# Patient Record
Sex: Female | Born: 1990 | Race: Black or African American | Hispanic: No | Marital: Single | Smoking: Current some day smoker
Health system: Southern US, Community
[De-identification: ages and names within clinical notes are randomized; demographics above are authoritative.]

## PROBLEM LIST (undated history)

## (undated) ENCOUNTER — Inpatient Hospital Stay (HOSPITAL_COMMUNITY): Payer: Self-pay

## (undated) DIAGNOSIS — O139 Gestational [pregnancy-induced] hypertension without significant proteinuria, unspecified trimester: Secondary | ICD-10-CM

## (undated) HISTORY — PX: DILATION AND CURETTAGE OF UTERUS: SHX78

## (undated) HISTORY — DX: Gestational (pregnancy-induced) hypertension without significant proteinuria, unspecified trimester: O13.9

---
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
## 2008-11-30 ENCOUNTER — Emergency Department (HOSPITAL_COMMUNITY): Admission: EM | Admit: 2008-11-30 | Discharge: 2008-11-30 | Payer: Self-pay | Admitting: Emergency Medicine

## 2009-06-03 ENCOUNTER — Inpatient Hospital Stay (HOSPITAL_COMMUNITY): Admission: AD | Admit: 2009-06-03 | Discharge: 2009-06-04 | Payer: Self-pay | Admitting: Obstetrics and Gynecology

## 2009-06-22 ENCOUNTER — Inpatient Hospital Stay (HOSPITAL_COMMUNITY): Admission: AD | Admit: 2009-06-22 | Discharge: 2009-06-24 | Payer: Self-pay | Admitting: Obstetrics and Gynecology

## 2009-06-23 ENCOUNTER — Encounter (INDEPENDENT_AMBULATORY_CARE_PROVIDER_SITE_OTHER): Payer: Self-pay | Admitting: Obstetrics and Gynecology

## 2009-08-14 ENCOUNTER — Emergency Department: Payer: Self-pay | Admitting: Emergency Medicine

## 2010-01-26 ENCOUNTER — Emergency Department: Payer: Self-pay | Admitting: Emergency Medicine

## 2010-02-04 ENCOUNTER — Emergency Department: Payer: Self-pay | Admitting: Emergency Medicine

## 2010-06-09 ENCOUNTER — Ambulatory Visit (HOSPITAL_COMMUNITY)
Admission: RE | Admit: 2010-06-09 | Discharge: 2010-06-09 | Disposition: A | Discharge status: Home / Self Care | Payer: Medicaid Other | Source: Ambulatory Visit | Attending: Obstetrics and Gynecology | Admitting: Obstetrics and Gynecology

## 2010-06-09 ENCOUNTER — Other Ambulatory Visit: Payer: Self-pay | Admitting: Obstetrics and Gynecology

## 2010-06-09 DIAGNOSIS — O021 Missed abortion: Secondary | ICD-10-CM | POA: Insufficient documentation

## 2010-06-09 LAB — DIFFERENTIAL
Lymphocytes Relative: 61 % — ABNORMAL HIGH (ref 12–46)
Lymphs Abs: 2.2 10*3/uL (ref 0.7–4.0)
Monocytes Absolute: 0.4 10*3/uL (ref 0.1–1.0)
Monocytes Relative: 12 % (ref 3–12)
Neutro Abs: 1 10*3/uL — ABNORMAL LOW (ref 1.7–7.7)
Neutrophils Relative %: 27 % — ABNORMAL LOW (ref 43–77)

## 2010-06-09 LAB — CBC
HCT: 37.1 % (ref 36.0–46.0)
Hemoglobin: 12.4 g/dL (ref 12.0–15.0)
MCH: 27.8 pg (ref 26.0–34.0)
MCHC: 33.4 g/dL (ref 30.0–36.0)
RBC: 4.46 MIL/uL (ref 3.87–5.11)

## 2010-06-22 DEATH — deceased

## 2010-07-13 LAB — URINALYSIS, ROUTINE W REFLEX MICROSCOPIC
Bilirubin Urine: NEGATIVE
Glucose, UA: NEGATIVE mg/dL
Ketones, ur: NEGATIVE mg/dL
Nitrite: NEGATIVE
Specific Gravity, Urine: <1.005 — ABNORMAL LOW (ref 1.005–1.030)
pH: 7 (ref 5.0–8.0)

## 2010-07-17 LAB — COMPREHENSIVE METABOLIC PANEL
AST: 17 U/L (ref 0–37)
Albumin: 2.2 g/dL — ABNORMAL LOW (ref 3.5–5.2)
BUN: 1 mg/dL — ABNORMAL LOW (ref 6–23)
Calcium: 9.3 mg/dL (ref 8.4–10.5)
Creatinine, Ser: 0.59 mg/dL (ref 0.4–1.2)
GFR calc Af Amer: >60 mL/min (ref >60)
Total Bilirubin: 0.4 mg/dL (ref 0.3–1.2)
Total Protein: 5.5 g/dL — ABNORMAL LOW (ref 6.0–8.3)

## 2010-07-17 LAB — URINALYSIS, ROUTINE W REFLEX MICROSCOPIC
Glucose, UA: NEGATIVE mg/dL
Ketones, ur: NEGATIVE mg/dL
Leukocytes, UA: NEGATIVE
Nitrite: NEGATIVE
Specific Gravity, Urine: 1.01 (ref 1.005–1.030)
pH: 7 (ref 5.0–8.0)

## 2010-07-17 LAB — CBC
HCT: 37.3 % (ref 36.0–46.0)
Hemoglobin: 11.7 g/dL — ABNORMAL LOW (ref 12.0–15.0)
Hemoglobin: 12.3 g/dL (ref 12.0–15.0)
MCHC: 32.7 g/dL (ref 30.0–36.0)
MCHC: 32.9 g/dL (ref 30.0–36.0)
MCV: 84.2 fL (ref 78.0–100.0)
MCV: 85.4 fL (ref 78.0–100.0)
Platelets: 309 10*3/uL (ref 150–400)
Platelets: 332 10*3/uL (ref 150–400)
RBC: 4.45 MIL/uL (ref 3.87–5.11)
RDW: 14.6 % (ref 11.5–15.5)
RDW: 14.8 % (ref 11.5–15.5)
WBC: 5.6 10*3/uL (ref 4.0–10.5)
WBC: 8.1 10*3/uL (ref 4.0–10.5)

## 2010-07-17 LAB — RAPID URINE DRUG SCREEN, HOSP PERFORMED
Amphetamines: NOT DETECTED
Barbiturates: NOT DETECTED
Benzodiazepines: NOT DETECTED
Cocaine: NOT DETECTED

## 2010-07-17 LAB — URIC ACID: Uric Acid, Serum: 4.5 mg/dL (ref 2.4–7.0)

## 2010-07-17 LAB — URINE MICROSCOPIC-ADD ON

## 2010-07-17 LAB — RPR: RPR Ser Ql: NONREACTIVE

## 2010-07-30 LAB — DIFFERENTIAL
Basophils Absolute: 0 10*3/uL (ref 0.0–0.1)
Basophils Relative: 1 % (ref 0–1)
Eosinophils Absolute: 0.1 10*3/uL (ref 0.0–0.7)
Neutrophils Relative %: 49 % (ref 43–77)

## 2010-07-30 LAB — URINALYSIS, ROUTINE W REFLEX MICROSCOPIC
Bilirubin Urine: NEGATIVE
Hgb urine dipstick: NEGATIVE
Nitrite: NEGATIVE
Protein, ur: NEGATIVE mg/dL
Urobilinogen, UA: 0.2 mg/dL (ref 0.0–1.0)

## 2010-07-30 LAB — CBC
MCHC: 33.8 g/dL (ref 30.0–36.0)
MCV: 86 fL (ref 78.0–100.0)
Platelets: 293 10*3/uL (ref 150–400)
RDW: 14.1 % (ref 11.5–15.5)

## 2010-07-30 LAB — WET PREP, GENITAL
Trich, Wet Prep: NONE SEEN
Yeast Wet Prep HPF POC: NONE SEEN

## 2010-07-30 LAB — ABO/RH: ABO/RH(D): O POS

## 2010-07-30 LAB — URINE MICROSCOPIC-ADD ON

## 2010-07-30 LAB — BASIC METABOLIC PANEL
BUN: 5 mg/dL — ABNORMAL LOW (ref 6–23)
CO2: 25 mEq/L (ref 19–32)
Chloride: 105 mEq/L (ref 96–112)
Creatinine, Ser: 0.65 mg/dL (ref 0.4–1.2)

## 2010-07-30 LAB — GC/CHLAMYDIA PROBE AMP, GENITAL: GC Probe Amp, Genital: NEGATIVE

## 2010-07-30 LAB — HCG, QUANTITATIVE, PREGNANCY: hCG, Beta Chain, Quant, S: 104616 m[IU]/mL — ABNORMAL HIGH (ref <5)

## 2011-02-25 ENCOUNTER — Emergency Department: Payer: Self-pay | Admitting: *Deleted

## 2011-04-06 ENCOUNTER — Emergency Department: Payer: Self-pay | Admitting: Emergency Medicine

## 2011-04-24 NOTE — L&D Delivery Note (Signed)
Delivery Note At 4:06 AM a viable and healthy female was delivered via Vaginal, Spontaneous Delivery (Presentation: Right Occiput Anterior).  APGAR: 8, 9; weight 6 lb 13.4 oz (3100 g).   Placenta status: expressed, intact.  Cord: 3 vessels with the following complications: None.    Anesthesia: Epidural  Episiotomy: None Lacerations: None Suture Repair: N/A Est. Blood Loss (mL): 200 ml  Mom to postpartum.  Baby to nursery-stable.  LEFTWICH-KIRBY, Doyle Kunath 11/25/2011, 4:29 AM

## 2011-05-01 ENCOUNTER — Inpatient Hospital Stay (HOSPITAL_COMMUNITY)
Admission: AD | Admit: 2011-05-01 | Discharge: 2011-05-01 | Disposition: A | Discharge status: Home / Self Care | Payer: Medicaid Other | Source: Ambulatory Visit | Attending: Obstetrics and Gynecology | Admitting: Obstetrics and Gynecology

## 2011-05-01 ENCOUNTER — Encounter (HOSPITAL_COMMUNITY): Payer: Self-pay | Admitting: *Deleted

## 2011-05-01 DIAGNOSIS — B379 Candidiasis, unspecified: Secondary | ICD-10-CM

## 2011-05-01 DIAGNOSIS — O2390 Unspecified genitourinary tract infection in pregnancy, unspecified trimester: Secondary | ICD-10-CM | POA: Insufficient documentation

## 2011-05-01 DIAGNOSIS — B373 Candidiasis of vulva and vagina: Secondary | ICD-10-CM | POA: Insufficient documentation

## 2011-05-01 DIAGNOSIS — N949 Unspecified condition associated with female genital organs and menstrual cycle: Secondary | ICD-10-CM | POA: Insufficient documentation

## 2011-05-01 DIAGNOSIS — N760 Acute vaginitis: Secondary | ICD-10-CM

## 2011-05-01 DIAGNOSIS — B3731 Acute candidiasis of vulva and vagina: Secondary | ICD-10-CM | POA: Insufficient documentation

## 2011-05-01 DIAGNOSIS — B9689 Other specified bacterial agents as the cause of diseases classified elsewhere: Secondary | ICD-10-CM | POA: Insufficient documentation

## 2011-05-01 DIAGNOSIS — A499 Bacterial infection, unspecified: Secondary | ICD-10-CM | POA: Insufficient documentation

## 2011-05-01 MED ORDER — METRONIDAZOLE 500 MG PO TABS: 500.0000 mg | ORAL_TABLET | Freq: Two times a day (BID) | ORAL | Status: AC

## 2011-05-01 MED ORDER — FLUCONAZOLE 150 MG PO TABS
150.0000 mg | ORAL_TABLET | Freq: Once | ORAL | Status: AC
  Administered 2011-05-01: 150 mg via ORAL
  Filled 2011-05-01: qty 1

## 2011-05-01 NOTE — ED Provider Notes (Signed)
Agree with above note.  [name] [name] 05/01/2011 7:17 AM

## 2011-05-01 NOTE — ED Provider Notes (Signed)
History     CSN: [id number]  Arrival date & time 05/01/11  0026   None     Chief Complaint  Patient presents with  . Vaginal Discharge    HPI [name] [name] is a 21 y.o. female @ [redacted] weeks gestation who presents to MAU for vaginal discharge. She states that a couple weeks ago she went to [facility] and had pregnancy conformation and treatment for BV. She seemed to get better and then the vaginal discharge and itching returned. She plans her [facility] with [facility]. The history was provided by the patient.   History reviewed. No pertinent past medical history.  Past Surgical History  Procedure Date  . Dilation and curettage of uterus     Family History  Problem Relation Age of Onset  . Anesthesia problems Neg Hx   . Hypotension Neg Hx   . Malignant hyperthermia Neg Hx   . Pseudochol deficiency Neg Hx     History  Substance Use Topics  . Smoking status: Current Some Day Smoker -- 0.2 packs/day  . Smokeless tobacco: Never Used  . Alcohol Use: No    OB History    Grav Para Term Preterm Abortions TAB SAB Ect Mult Living   4 2 2  1  1   2       Review of Systems  Constitutional: Negative for fever, chills, diaphoresis and fatigue.  HENT: Negative for ear pain, congestion, sore throat, facial swelling, neck pain, neck stiffness, dental problem and sinus pressure.   Eyes: Negative for photophobia, pain and discharge.  Respiratory: Negative for cough, chest tightness and wheezing.   Gastrointestinal: Negative for nausea, vomiting, abdominal pain, diarrhea, constipation and abdominal distention.  Genitourinary: Positive for vaginal discharge. Negative for dysuria, frequency, flank pain, vaginal bleeding, difficulty urinating and vaginal pain.       Pregnant  Musculoskeletal: Negative for myalgias, back pain and gait problem.  Skin: Negative for color change and rash.  Neurological: Negative for dizziness, speech difficulty, weakness, light-headedness, numbness and headaches.    Psychiatric/Behavioral: Negative for confusion and agitation.    Allergies  Review of patient's allergies indicates no known allergies.  Home Medications  No current outpatient prescriptions on file.  BP 123/51  Pulse 89  Temp(Src) 98.9 F (37.2 C) (Oral)  Resp 20  Ht 5\' 1"  (1.549 m)  Wt 138 lb (62.596 kg)  BMI 26.07 kg/m2  SpO2 99%  LMP 02/14/2011  Physical Exam  Nursing note and vitals reviewed. Constitutional: She is oriented to person, place, and time. She appears well-developed and well-nourished.  HENT:  Head: Normocephalic.  Eyes: EOM are normal.  Neck: Neck supple.  Cardiovascular: Normal rate.   Pulmonary/Chest: Effort normal.  Abdominal: Soft. There is no tenderness.  Genitourinary:       External genitalia without lesions. Thick white vaginal discharge. Cervix long, closed, no CMT, no adnexal tenderness or mass palpable. Uterus 10 to 12 week size.  Musculoskeletal: Normal range of motion.  Neurological: She is alert and oriented to person, place, and time. No cranial nerve deficit.  Skin: Skin is warm and dry.  Psychiatric: She has a normal mood and affect. Her behavior is normal. Judgment and thought content normal.   Results for orders placed during the hospital encounter of 05/01/11 (from the past 24 hour(s))  WET PREP, GENITAL     Status: Abnormal   Collection Time   05/01/11  1:15 AM      Component Value Range   Yeast,  Wet Prep FEW (*) NONE SEEN    Trich, Wet Prep NONE SEEN  NONE SEEN    Clue Cells, Wet Prep MODERATE (*) NONE SEEN    WBC, Wet Prep HPF POC MANY (*) NONE SEEN     ED Course  Procedures Informal bedside ultrasound shows active IUP. Patient able to visualize activity.  Assessment: Bacterial vaginosis   Yeast infection  Plan:  Diflucan 150 mg now   Flagyl Rx   Prenatal care with [facility]   MDM          Kerrie [place], [place] 05/01/11 [phone]

## 2011-06-11 ENCOUNTER — Encounter (HOSPITAL_COMMUNITY): Payer: Self-pay | Admitting: *Deleted

## 2011-07-06 ENCOUNTER — Inpatient Hospital Stay (HOSPITAL_COMMUNITY)
Admission: AD | Admit: 2011-07-06 | Discharge: 2011-07-06 | Disposition: A | Discharge status: Home / Self Care | Payer: Medicaid Other | Source: Ambulatory Visit | Attending: Obstetrics & Gynecology | Admitting: Obstetrics & Gynecology

## 2011-07-06 ENCOUNTER — Encounter (HOSPITAL_COMMUNITY): Payer: Self-pay | Admitting: *Deleted

## 2011-07-06 DIAGNOSIS — N390 Urinary tract infection, site not specified: Secondary | ICD-10-CM | POA: Insufficient documentation

## 2011-07-06 DIAGNOSIS — O2390 Unspecified genitourinary tract infection in pregnancy, unspecified trimester: Secondary | ICD-10-CM | POA: Insufficient documentation

## 2011-07-06 DIAGNOSIS — N949 Unspecified condition associated with female genital organs and menstrual cycle: Secondary | ICD-10-CM

## 2011-07-06 DIAGNOSIS — R109 Unspecified abdominal pain: Secondary | ICD-10-CM | POA: Insufficient documentation

## 2011-07-06 LAB — URINALYSIS, ROUTINE W REFLEX MICROSCOPIC
Glucose, UA: NEGATIVE mg/dL
Leukocytes, UA: NEGATIVE
Nitrite: NEGATIVE
Specific Gravity, Urine: <1.005 — ABNORMAL LOW (ref 1.005–1.030)
pH: 7 (ref 5.0–8.0)

## 2011-07-06 MED ORDER — PRENATAL RX 60-1 MG PO TABS: 1.0000 | ORAL_TABLET | Freq: Every day | ORAL | Status: AC

## 2011-07-06 NOTE — MAU Note (Signed)
Pt in c/o frequency of urination, and inability to control bladder. Denies any burning or pain with urination.  Denies any bleeding, leaking of fluid, or discharge.  Felt flutters 2 weeks ago, not much since.

## 2011-07-06 NOTE — MAU Provider Note (Signed)
History    CSN: [id number]  Arrival date and time: 07/06/11 0913  [name] [name] is a [id number] at [redacted]w[redacted]d presenting with pelvic pain       Chief Complaint  Patient presents with  . Urinary Tract Infection   HPI [name] [name] is a 21 yo [id number] at [redacted]w[redacted]d presenting with pelvic pain for 2 weeks.  Patient describes the pelvic pain as cramping with occasional sharper pains with a rating 6-7/10.  Patient also c/o increased urine frequency and urgency.  She denies dysuria, hesitancy,vaginal discharge or vaginal bleeding.  She also denies low back pain, but does have occasional sciatica down her legs.  She doesn't recall having similar pains with past pregnancies or previous UTIs.  Patient declined pelvic exam.  Pertinent Gynecological History: Sexually transmitted diseases: no past history Previous GYN Procedures: DNC in 2012   History reviewed. No pertinent past medical history.  Past Surgical History  Procedure Date  . Dilation and curettage of uterus     Family History  Problem Relation Age of Onset  . Anesthesia problems Neg Hx   . Hypotension Neg Hx   . Malignant hyperthermia Neg Hx   . Pseudochol deficiency Neg Hx     History  Substance Use Topics  . Smoking status: Current Some Day Smoker -- 0.2 packs/day  . Smokeless tobacco: Never Used  . Alcohol Use: No    Allergies: No Known Allergies  Prescriptions prior to admission  Medication Sig Dispense Refill  . Prenatal Vit-Fe Fumarate-FA (PRENATAL MULTIVITAMIN) TABS Take 1 tablet by mouth daily.          Review of Systems  Constitutional: Negative for fever, chills and malaise/fatigue.  Gastrointestinal: Negative for nausea, vomiting and diarrhea.  Musculoskeletal: Negative for back pain (occasional sciatica).   Physical Exam   Blood pressure 129/76, pulse 84, temperature 96.4 F (35.8 C), temperature source Oral, resp. rate 16, height 5\' 1"  (1.549 m), weight 67.189 kg (148 lb 2 oz), last menstrual period  02/14/2011.  Physical Exam  Constitutional: She is oriented to person, place, and time. She appears well-developed and well-nourished. No distress.  HENT:  Head: Normocephalic and atraumatic.  Neck: Neck supple.  Respiratory: Effort normal. No respiratory distress.  GI: Soft. Bowel sounds are normal. She exhibits distension (gravida). There is no tenderness.  Musculoskeletal: Normal range of motion.  Neurological: She is alert and oriented to person, place, and time.  Pelvic: Deferred   MAU Course  Procedures Results for orders placed during the hospital encounter of 07/06/11 (from the past 24 hour(s))  URINALYSIS, ROUTINE W REFLEX MICROSCOPIC     Status: Abnormal   Collection Time   07/06/11  9:15 AM      Component Value Range   Color, Urine STRAW (*) YELLOW    APPearance CLEAR  CLEAR    Specific Gravity, Urine <1.005 (*) 1.005 - 1.030    pH 7.0  5.0 - 8.0    Glucose, UA NEGATIVE  NEGATIVE (mg/dL)   Hgb urine dipstick NEGATIVE  NEGATIVE    Bilirubin Urine NEGATIVE  NEGATIVE    Ketones, ur NEGATIVE  NEGATIVE (mg/dL)   Protein, ur NEGATIVE  NEGATIVE (mg/dL)   Urobilinogen, UA 0.2  0.0 - 1.0 (mg/dL)   Nitrite NEGATIVE  NEGATIVE    Leukocytes, UA NEGATIVE  NEGATIVE    Assessment and Plan  21 yo [id number] at [redacted]w[redacted]d presenting with round ligament pain  Provided educational material for round ligament pain.  Patient aware to seek medical  treatment if she experiences vaginal bleeding, abnormal vaginal discharge, or increased pain. Discharge home.  Mardene Speak 07/06/2011, 10:25 AM

## 2011-07-06 NOTE — MAU Note (Cosign Needed)
Pt states having bladder pain, has urgency, no burning with voiding, denies bleeding or abnormal vaginal d/c changes. Pain has been intermittent x2-3 days, worsened this am. Urine has a pale yellow/light green color per pt.

## 2011-07-06 NOTE — MAU Note (Signed)
Pt given d/c instruction and discharged by resident in MAU. D/Cing pt out of Epic for [place], [employer]

## 2011-07-18 ENCOUNTER — Observation Stay: Payer: Self-pay | Admitting: Advanced Practice Midwife

## 2011-07-18 LAB — URINALYSIS, COMPLETE
Bilirubin,UR: NEGATIVE
Glucose,UR: NEGATIVE mg/dL (ref 0–75)
Leukocyte Esterase: NEGATIVE
Nitrite: NEGATIVE
Ph: 6 (ref 4.5–8.0)
RBC,UR: NONE SEEN /HPF (ref 0–5)
Squamous Epithelial: 4
WBC UR: 1 /HPF (ref 0–5)

## 2011-08-06 ENCOUNTER — Ambulatory Visit: Payer: Medicaid Other | Admitting: Gynecology

## 2011-08-06 VITALS — BP 115/61 | Wt 149.0 lb

## 2011-08-06 DIAGNOSIS — O3680X0 Pregnancy with inconclusive fetal viability, not applicable or unspecified: Secondary | ICD-10-CM

## 2011-08-06 DIAGNOSIS — Z3480 Encounter for supervision of other normal pregnancy, unspecified trimester: Secondary | ICD-10-CM

## 2011-08-07 ENCOUNTER — Ambulatory Visit (HOSPITAL_COMMUNITY)
Admission: RE | Admit: 2011-08-07 | Discharge: 2011-08-07 | Disposition: A | Discharge status: Home / Self Care | Payer: Medicaid Other | Source: Ambulatory Visit | Attending: Family Medicine | Admitting: Family Medicine

## 2011-08-07 DIAGNOSIS — Z3689 Encounter for other specified antenatal screening: Secondary | ICD-10-CM | POA: Insufficient documentation

## 2011-08-07 DIAGNOSIS — O3680X0 Pregnancy with inconclusive fetal viability, not applicable or unspecified: Secondary | ICD-10-CM

## 2011-08-07 LAB — OBSTETRIC PANEL
Antibody Screen: NEGATIVE
Basophils Absolute: 0 10*3/uL (ref 0.0–0.1)
HCT: 37.3 % (ref 36.0–46.0)
Hemoglobin: 12.4 g/dL (ref 12.0–15.0)
Hepatitis B Surface Ag: NEGATIVE
Lymphocytes Relative: 29 % (ref 12–46)
Monocytes Absolute: 0.5 10*3/uL (ref 0.1–1.0)
Monocytes Relative: 9 % (ref 3–12)
Neutro Abs: 3.5 10*3/uL (ref 1.7–7.7)
Neutrophils Relative %: 60 % (ref 43–77)
Rubella: 144.5 IU/mL — ABNORMAL HIGH
WBC: 5.8 10*3/uL (ref 4.0–10.5)

## 2011-08-08 ENCOUNTER — Encounter: Payer: Self-pay | Admitting: Obstetrics & Gynecology

## 2011-08-08 ENCOUNTER — Ambulatory Visit (INDEPENDENT_AMBULATORY_CARE_PROVIDER_SITE_OTHER): Payer: Medicaid Other | Admitting: Obstetrics & Gynecology

## 2011-08-08 ENCOUNTER — Other Ambulatory Visit (HOSPITAL_COMMUNITY)
Admission: RE | Admit: 2011-08-08 | Discharge: 2011-08-08 | Disposition: A | Discharge status: Home / Self Care | Payer: Medicaid Other | Source: Ambulatory Visit | Attending: Obstetrics & Gynecology | Admitting: Obstetrics & Gynecology

## 2011-08-08 VITALS — BP 122/62 | Wt 148.0 lb

## 2011-08-08 DIAGNOSIS — IMO0002 Reserved for concepts with insufficient information to code with codable children: Secondary | ICD-10-CM

## 2011-08-08 DIAGNOSIS — O99330 Smoking (tobacco) complicating pregnancy, unspecified trimester: Secondary | ICD-10-CM

## 2011-08-08 DIAGNOSIS — Z113 Encounter for screening for infections with a predominantly sexual mode of transmission: Secondary | ICD-10-CM | POA: Insufficient documentation

## 2011-08-08 DIAGNOSIS — Z3490 Encounter for supervision of normal pregnancy, unspecified, unspecified trimester: Secondary | ICD-10-CM | POA: Insufficient documentation

## 2011-08-08 DIAGNOSIS — Z01419 Encounter for gynecological examination (general) (routine) without abnormal findings: Secondary | ICD-10-CM | POA: Insufficient documentation

## 2011-08-08 DIAGNOSIS — O0930 Supervision of pregnancy with insufficient antenatal care, unspecified trimester: Secondary | ICD-10-CM

## 2011-08-08 DIAGNOSIS — Z3480 Encounter for supervision of other normal pregnancy, unspecified trimester: Secondary | ICD-10-CM

## 2011-08-08 HISTORY — DX: Encounter for supervision of normal pregnancy, unspecified, unspecified trimester: Z34.90

## 2011-08-08 HISTORY — DX: Smoking (tobacco) complicating pregnancy, unspecified trimester: O99.330

## 2011-08-08 HISTORY — DX: Supervision of pregnancy with insufficient antenatal care, unspecified trimester: O09.30

## 2011-08-08 NOTE — Progress Notes (Signed)
   Subjective:    [name] [name] is a [id number] [redacted]w[redacted]d being seen today for her first obstetrical visit.  Her obstetrical history is significant for late prenatal care and smoking in pregnancy. Patient does not intend to breast feed. Pregnancy history fully reviewed.  She underwent an ultrasound a few days days ago, normal anatomy. Too late for genetic testing.  Patient reports no complaints.  Filed Vitals:   08/08/11 0800  BP: 122/62  Weight: 148 lb (67.132 kg)    HISTORY: OB History    Grav Para Term Preterm Abortions TAB SAB Ect Mult Living   4 2 2  1  1   2      # Outc Date GA Lbr Len/2nd Wgt Sex Del Anes PTL Lv   1 TRM 8/09    F SVD EPI  Yes   2 TRM 3/11 [redacted]w[redacted]d   F SVD EPI  Yes   3 SAB 2012 [redacted]w[redacted]d   U    No   Comments: miscarriage   4 CUR              Past Medical History  Diagnosis Date  . Pregnancy induced hypertension 2009   Past Surgical History  Procedure Date  . Dilation and curettage of uterus    Family History  Problem Relation Age of Onset  . Anesthesia problems Neg Hx   . Hypotension Neg Hx   . Malignant hyperthermia Neg Hx   . Pseudochol deficiency Neg Hx      Exam    Uterus:     Pelvic Exam:    Perineum: Normal Perineum   Vulva: normal   Vagina:  normal mucosa, normal discharge   pH: Not done   Cervix: multiparous appearance, no bleeding following Pap, no cervical motion tenderness and no lesions   Adnexa: normal adnexa and no mass, fullness, tenderness   Bony Pelvis: gynecoid  System: Breast:  normal appearance, no masses or tenderness   Skin: normal coloration and turgor, no rashes    Neurologic: normal   Extremities: normal strength, tone, and muscle mass   HEENT PERRLA and extra ocular movement intact   Mouth/Teeth mucous membranes moist, pharynx normal without lesions   Neck supple and no masses   Cardiovascular: regular rate and rhythm   Respiratory:  appears well, vitals normal, no respiratory distress, acyanotic, normal RR   Abdomen:  soft, non-tender; bowel sounds normal; no masses,  no organomegaly   Urinary: urethral meatus normal     Assessment:    Pregnancy: [id number] Patient Active Problem List  Diagnoses  . Supervision of normal pregnancy  . Late prenatal care  . Tobacco smoking complicating pregnancy    Plan:   Initial labs normal. Continue prenatal vitamins. Problem list reviewed and updated. Reports smoking 3 cigarettes/week.  Encouraged to quit, resources reviewed. Follow up in 3 weeks for 1 hr GTT, third trimester labs. Fetal movement and labor precautions reviewed.   Jaydence Arnesen A 08/08/2011

## 2011-08-08 NOTE — Patient Instructions (Signed)
Pregnancy - Second Trimester The second trimester of pregnancy (3 to 6 months) is a period of rapid growth for you and your baby. At the end of the sixth month, your baby is about 9 inches long and weighs 1 1/2 pounds. You will begin to feel the baby move between 18 and 20 weeks of the pregnancy. This is called quickening. Weight gain is faster. A clear fluid (colostrum) may leak out of your breasts. You may feel small contractions of the womb (uterus). This is known as false labor or Braxton-Hicks contractions. This is like a practice for labor when the baby is ready to be born. Usually, the problems with morning sickness have usually passed by the end of your first trimester. Some women develop small dark blotches (called cholasma, mask of pregnancy) on their face that usually goes away after the baby is born. Exposure to the sun makes the blotches worse. Acne may also develop in some pregnant women and pregnant women who have acne, may find that it goes away. PRENATAL EXAMS  Blood work may continue to be done during prenatal exams. These tests are done to check on your health and the probable health of your baby. Blood work is used to follow your blood levels (hemoglobin). Anemia (low hemoglobin) is common during pregnancy. Iron and vitamins are given to help prevent this. You will also be checked for diabetes between 24 and 28 weeks of the pregnancy. Some of the previous blood tests may be repeated.   The size of the uterus is measured during each visit. This is to make sure that the baby is continuing to grow properly according to the dates of the pregnancy.   Your blood pressure is checked every prenatal visit. This is to make sure you are not getting toxemia.   Your urine is checked to make sure you do not have an infection, diabetes or protein in the urine.   Your weight is checked often to make sure gains are happening at the suggested rate. This is to ensure that both you and your baby are  growing normally.   Sometimes, an ultrasound is performed to confirm the proper growth and development of the baby. This is a test which bounces harmless sound waves off the baby so your caregiver can more accurately determine due dates.  Sometimes, a specialized test is done on the amniotic fluid surrounding the baby. This test is called an amniocentesis. The amniotic fluid is obtained by sticking a needle into the belly (abdomen). This is done to check the chromosomes in instances where there is a concern about possible genetic problems with the baby. It is also sometimes done near the end of pregnancy if an early delivery is required. In this case, it is done to help make sure the baby's lungs are mature enough for the baby to live outside of the womb. CHANGES OCCURING IN THE SECOND TRIMESTER OF PREGNANCY Your body goes through many changes during pregnancy. They vary from person to person. Talk to your caregiver about changes you notice that you are concerned about.  During the second trimester, you will likely have an increase in your appetite. It is normal to have cravings for certain foods. This varies from person to person and pregnancy to pregnancy.   Your lower abdomen will begin to bulge.   You may have to urinate more often because the uterus and baby are pressing on your bladder. It is also common to get more bladder infections during pregnancy (  pain with urination). You can help this by drinking lots of fluids and emptying your bladder before and after intercourse.   You may begin to get stretch marks on your hips, abdomen, and breasts. These are normal changes in the body during pregnancy. There are no exercises or medications to take that prevent this change.   You may begin to develop swollen and bulging veins (varicose veins) in your legs. Wearing support hose, elevating your feet for 15 minutes, 3 to 4 times a day and limiting salt in your diet helps lessen the problem.    Heartburn may develop as the uterus grows and pushes up against the stomach. Antacids recommended by your caregiver helps with this problem. Also, eating smaller meals 4 to 5 times a day helps.   Constipation can be treated with a stool softener or adding bulk to your diet. Drinking lots of fluids, vegetables, fruits, and whole grains are helpful.   Exercising is also helpful. If you have been very active up until your pregnancy, most of these activities can be continued during your pregnancy. If you have been less active, it is helpful to start an exercise program such as walking.   Hemorrhoids (varicose veins in the rectum) may develop at the end of the second trimester. Warm sitz baths and hemorrhoid cream recommended by your caregiver helps hemorrhoid problems.   Backaches may develop during this time of your pregnancy. Avoid heavy lifting, wear low heal shoes and practice good posture to help with backache problems.   Some pregnant women develop tingling and numbness of their hand and fingers because of swelling and tightening of ligaments in the wrist (carpel tunnel syndrome). This goes away after the baby is born.   As your breasts enlarge, you may have to get a bigger bra. Get a comfortable, cotton, support bra. Do not get a nursing bra until the last month of the pregnancy if you will be nursing the baby.   You may get a dark line from your belly button to the pubic area called the linea nigra.   You may develop rosy cheeks because of increase blood flow to the face.   You may develop spider looking lines of the face, neck, arms and chest. These go away after the baby is born.  HOME CARE INSTRUCTIONS   It is extremely important to avoid all smoking, herbs, alcohol, and unprescribed drugs during your pregnancy. These chemicals affect the formation and growth of the baby. Avoid these chemicals throughout the pregnancy to ensure the delivery of a healthy infant.   Most of your home  care instructions are the same as suggested for the first trimester of your pregnancy. Keep your caregiver's appointments. Follow your caregiver's instructions regarding medication use, exercise and diet.   During pregnancy, you are providing food for you and your baby. Continue to eat regular, well-balanced meals. Choose foods such as meat, fish, milk and other low fat dairy products, vegetables, fruits, and whole-grain breads and cereals. Your caregiver will tell you of the ideal weight gain.   A physical sexual relationship may be continued up until near the end of pregnancy if there are no other problems. Problems could include early (premature) leaking of amniotic fluid from the membranes, vaginal bleeding, abdominal pain, or other medical or pregnancy problems.   Exercise regularly if there are no restrictions. Check with your caregiver if you are unsure of the safety of some of your exercises. The greatest weight gain will occur in the   last 2 trimesters of pregnancy. Exercise will help you:   Control your weight.   Get you in shape for labor and delivery.   Lose weight after you have the baby.   Wear a good support or jogging bra for breast tenderness during pregnancy. This may help if worn during sleep. Pads or tissues may be used in the bra if you are leaking colostrum.   Do not use hot tubs, steam rooms or saunas throughout the pregnancy.   Wear your seat belt at all times when driving. This protects you and your baby if you are in an accident.   Avoid raw meat, uncooked cheese, cat litter boxes and soil used by cats. These carry germs that can cause birth defects in the baby.   The second trimester is also a good time to visit your dentist for your dental health if this has not been done yet. Getting your teeth cleaned is OK. Use a soft toothbrush. Brush gently during pregnancy.   It is easier to loose urine during pregnancy. Tightening up and strengthening the pelvic muscles will  help with this problem. Practice stopping your urination while you are going to the bathroom. These are the same muscles you need to strengthen. It is also the muscles you would use as if you were trying to stop from passing gas. You can practice tightening these muscles up 10 times a set and repeating this about 3 times per day. Once you know what muscles to tighten up, do not perform these exercises during urination. It is more likely to contribute to an infection by backing up the urine.   Ask for help if you have financial, counseling or nutritional needs during pregnancy. Your caregiver will be able to offer counseling for these needs as well as refer you for other special needs.   Your skin may become oily. If so, wash your face with mild soap, use non-greasy moisturizer and oil or cream based makeup.  MEDICATIONS AND DRUG USE IN PREGNANCY  Take prenatal vitamins as directed. The vitamin should contain 1 milligram of folic acid. Keep all vitamins out of reach of children. Only a couple vitamins or tablets containing iron may be fatal to a baby or young child when ingested.   Avoid use of all medications, including herbs, over-the-counter medications, not prescribed or suggested by your caregiver. Only take over-the-counter or prescription medicines for pain, discomfort, or fever as directed by your caregiver. Do not use aspirin.   Let your caregiver also know about herbs you may be using.   Alcohol is related to a number of birth defects. This includes fetal alcohol syndrome. All alcohol, in any form, should be avoided completely. Smoking will cause low birth rate and premature babies.   Street or illegal drugs are very harmful to the baby. They are absolutely forbidden. A baby born to an addicted mother will be addicted at birth. The baby will go through the same withdrawal an adult does.  SEEK MEDICAL CARE IF:  You have any concerns or worries during your pregnancy. It is better to call with  your questions if you feel they cannot wait, rather than worry about them. SEEK IMMEDIATE MEDICAL CARE IF:   An unexplained oral temperature above 102 F (38.9 C) develops, or as your caregiver suggests.   You have leaking of fluid from the vagina (birth canal). If leaking membranes are suspected, take your temperature and tell your caregiver of this when you call.   There   is vaginal spotting, bleeding, or passing clots. Tell your caregiver of the amount and how many pads are used. Light spotting in pregnancy is common, especially following intercourse.   You develop a bad smelling vaginal discharge with a change in the color from clear to white.   You continue to feel sick to your stomach (nauseated) and have no relief from remedies suggested. You vomit blood or coffee ground-like materials.   You lose more than 2 pounds of weight or gain more than 2 pounds of weight over 1 week, or as suggested by your caregiver.   You notice swelling of your face, hands, feet, or legs.   You get exposed to German measles and have never had them.   You are exposed to fifth disease or chickenpox.   You develop belly (abdominal) pain. Round ligament discomfort is a common non-cancerous (benign) cause of abdominal pain in pregnancy. Your caregiver still must evaluate you.   You develop a bad headache that does not go away.   You develop fever, diarrhea, pain with urination, or shortness of breath.   You develop visual problems, blurry, or double vision.   You fall or are in a car accident or any kind of trauma.   There is mental or physical violence at home.  Document Released: 04/03/2001 Document Revised: 03/29/2011 Document Reviewed: 10/06/2008 [employer] Patient Information 2012 [employer]. Breastfeeding BENEFITS OF BREASTFEEDING For the baby  The first milk (colostrum) helps the baby's digestive system function better.   There are antibodies from the mother in the milk that help the  baby fight off infections.   The baby has a lower incidence of asthma, allergies, and SIDS (sudden infant death syndrome).   The nutrients in breast milk are better than formulas for the baby and helps the baby's brain grow better.   Babies who breastfeed have less gas, colic, and constipation.  For the mother  Breastfeeding helps develop a very special bond between mother and baby.   It is more convenient, always available at the correct temperature and cheaper than formula feeding.   It burns calories in the mother and helps with losing weight that was gained during pregnancy.   It makes the uterus contract back down to normal size faster and slows bleeding following delivery.   Breastfeeding mothers have a lower risk of developing breast cancer.  NURSE FREQUENTLY  A healthy, full-term baby may breastfeed as often as every hour or space his or her feedings to every 3 hours.   How often to nurse will vary from baby to baby. Watch your baby for signs of hunger, not the clock.   Nurse as often as the baby requests, or when you feel the need to reduce the fullness of your breasts.   Awaken the baby if it has been 3 to 4 hours since the last feeding.   Frequent feeding will help the mother make more milk and will prevent problems like sore nipples and engorgement of the breasts.  BABY'S POSITION AT THE BREAST  Whether lying down or sitting, be sure that the baby's tummy is facing your tummy.   Support the breast with 4 fingers underneath the breast and the thumb above. Make sure your fingers are well away from the nipple and baby's mouth.   Stroke the baby's lips and cheek closest to the breast gently with your finger or nipple.   When the baby's mouth is open wide enough, place all of your nipple and as much   of the dark area around the nipple as possible into your baby's mouth.   Pull the baby in close so the tip of the nose and the baby's cheeks touch the breast during the  feeding.  FEEDINGS  The length of each feeding varies from baby to baby and from feeding to feeding.   The baby must suck about 2 to 3 minutes for your milk to get to him or her. This is called a "let down." For this reason, allow the baby to feed on each breast as long as he or she wants. Your baby will end the feeding when he or she has received the right balance of nutrients.   To break the suction, put your finger into the corner of the baby's mouth and slide it between his or her gums before removing your breast from his or her mouth. This will help prevent sore nipples.  REDUCING BREAST ENGORGEMENT  In the first week after your baby is born, you may experience signs of breast engorgement. When breasts are engorged, they feel heavy, warm, full, and may be tender to the touch. You can reduce engorgement if you:   Nurse frequently, every 2 to 3 hours. Mothers who breastfeed early and often have fewer problems with engorgement.   Place light ice packs on your breasts between feedings. This reduces swelling. Wrap the ice packs in a lightweight towel to protect your skin.   Apply moist hot packs to your breast for 5 to 10 minutes before each feeding. This increases circulation and helps the milk flow.   Gently massage your breast before and during the feeding.   Make sure that the baby empties at least one breast at every feeding before switching sides.   Use a breast pump to empty the breasts if your baby is sleepy or not nursing well. You may also want to pump if you are returning to work or or you feel you are getting engorged.   Avoid bottle feeds, pacifiers or supplemental feedings of water or juice in place of breastfeeding.   Be sure the baby is latched on and positioned properly while breastfeeding.   Prevent fatigue, stress, and anemia.   Wear a supportive bra, avoiding underwire styles.   Eat a balanced diet with enough fluids.  If you follow these suggestions, your  engorgement should improve in 24 to 48 hours. If you are still experiencing difficulty, call your lactation consultant or caregiver. IS MY BABY GETTING ENOUGH MILK? Sometimes, mothers worry about whether their babies are getting enough milk. You can be assured that your baby is getting enough milk if:  The baby is actively sucking and you hear swallowing.   The baby nurses at least 8 to 12 times in a 24 hour time period. Nurse your baby until he or she unlatches or falls asleep at the first breast (at least 10 to 20 minutes), then offer the second side.   The baby is wetting 5 to 6 disposable diapers (6 to 8 cloth diapers) in a 24 hour period by 5 to 6 days of age.   The baby is having at least 2 to 3 stools every 24 hours for the first few months. Breast milk is all the food your baby needs. It is not necessary for your baby to have water or formula. In fact, to help your breasts make more milk, it is best not to give your baby supplemental feedings during the early weeks.   The stool should   be soft and yellow.   The baby should gain 4 to 7 ounces per week after he is 4 days old.  TAKE CARE OF YOURSELF Take care of your breasts by:  Bathing or showering daily.   Avoiding the use of soaps on your nipples.   Start feedings on your left breast at one feeding and on your right breast at the next feeding.   You will notice an increase in your milk supply 2 to 5 days after delivery. You may feel some discomfort from engorgement, which makes your breasts very firm and often tender. Engorgement "peaks" out within 24 to 48 hours. In the meantime, apply warm moist towels to your breasts for 5 to 10 minutes before feeding. Gentle massage and expression of some milk before feeding will soften your breasts, making it easier for your baby to latch on. Wear a well fitting nursing bra and air dry your nipples for 10 to 15 minutes after each feeding.   Only use cotton bra pads.   Only use pure lanolin on  your nipples after nursing. You do not need to wash it off before nursing.  Take care of yourself by:   Eating well-balanced meals and nutritious snacks.   Drinking milk, fruit juice, and water to satisfy your thirst (about 8 glasses a day).   Getting plenty of rest.   Increasing calcium in your diet (1200 mg a day).   Avoiding foods that you notice affect the baby in a bad way.  SEEK MEDICAL CARE IF:   You have any questions or difficulty with breastfeeding.   You need help.   You have a hard, red, sore area on your breast, accompanied by a fever of 100.5 F (38.1 C) or more.   Your baby is too sleepy to eat well or is having trouble sleeping.   Your baby is wetting less than 6 diapers per day, by 5 days of age.   Your baby's skin or white part of his or her eyes is more yellow than it was in the hospital.   You feel depressed.  Document Released: 04/09/2005 Document Revised: 03/29/2011 Document Reviewed: 11/22/2008 [employer] Patient Information 2012 [employer]. 

## 2011-08-08 NOTE — Progress Notes (Signed)
Addended by: Jaynie Collins A on: 08/08/2011 11:34 AM   Modules accepted: Orders

## 2011-08-09 LAB — CULTURE, URINE COMPREHENSIVE: Colony Count: 10000

## 2011-08-16 ENCOUNTER — Inpatient Hospital Stay (HOSPITAL_COMMUNITY)
Admission: AD | Admit: 2011-08-16 | Discharge: 2011-08-16 | Discharge status: Against Medical Advice | Payer: Medicaid Other | Source: Ambulatory Visit | Attending: Obstetrics & Gynecology | Admitting: Obstetrics & Gynecology

## 2011-08-16 NOTE — [facility] Note (Signed)
Pt called at 1607, 1627, and 1640, not in lobby. Admissions registrar unsure where pt went.

## 2011-08-29 ENCOUNTER — Ambulatory Visit (INDEPENDENT_AMBULATORY_CARE_PROVIDER_SITE_OTHER): Payer: Medicaid Other | Admitting: Obstetrics & Gynecology

## 2011-08-29 ENCOUNTER — Encounter: Payer: Self-pay | Admitting: Obstetrics & Gynecology

## 2011-08-29 VITALS — BP 126/70 | Wt 155.0 lb

## 2011-08-29 DIAGNOSIS — Z3480 Encounter for supervision of other normal pregnancy, unspecified trimester: Secondary | ICD-10-CM

## 2011-08-29 DIAGNOSIS — Z3490 Encounter for supervision of normal pregnancy, unspecified, unspecified trimester: Secondary | ICD-10-CM

## 2011-08-29 DIAGNOSIS — O0930 Supervision of pregnancy with insufficient antenatal care, unspecified trimester: Secondary | ICD-10-CM

## 2011-08-29 NOTE — Progress Notes (Signed)
Patient is here for routine visit and glucose tolerance test.  She is due for blood draw at 9:40.

## 2011-08-29 NOTE — Progress Notes (Signed)
Routine visit. No problems. 1 hour glucola and labs today. Good FM, No VB, ROM, or CTXs.

## 2011-08-30 LAB — CBC
Hemoglobin: 12.1 g/dL (ref 12.0–15.0)
MCH: 29.5 pg (ref 26.0–34.0)
Platelets: 301 10*3/uL (ref 150–400)
RBC: 4.1 MIL/uL (ref 3.87–5.11)
WBC: 6.3 10*3/uL (ref 4.0–10.5)

## 2011-08-30 LAB — HIV ANTIBODY (ROUTINE TESTING W REFLEX): HIV: NONREACTIVE

## 2011-08-31 LAB — RPR

## 2011-09-04 ENCOUNTER — Encounter: Payer: Self-pay | Admitting: Obstetrics & Gynecology

## 2011-09-11 ENCOUNTER — Encounter: Payer: Medicaid Other | Admitting: Obstetrics & Gynecology

## 2011-09-12 ENCOUNTER — Ambulatory Visit (INDEPENDENT_AMBULATORY_CARE_PROVIDER_SITE_OTHER): Payer: Medicaid Other | Admitting: Obstetrics & Gynecology

## 2011-09-12 VITALS — BP 133/71 | Wt 154.0 lb

## 2011-09-12 DIAGNOSIS — O0930 Supervision of pregnancy with insufficient antenatal care, unspecified trimester: Secondary | ICD-10-CM

## 2011-09-12 DIAGNOSIS — IMO0002 Reserved for concepts with insufficient information to code with codable children: Secondary | ICD-10-CM

## 2011-09-12 DIAGNOSIS — Z3480 Encounter for supervision of other normal pregnancy, unspecified trimester: Secondary | ICD-10-CM

## 2011-09-12 DIAGNOSIS — Z3490 Encounter for supervision of normal pregnancy, unspecified, unspecified trimester: Secondary | ICD-10-CM

## 2011-09-12 DIAGNOSIS — O99330 Smoking (tobacco) complicating pregnancy, unspecified trimester: Secondary | ICD-10-CM

## 2011-09-12 NOTE — Progress Notes (Signed)
Occasional pelvic pressure with fetal movement otherwise no complaints or concerns. 1 hr GTT 78. Fetal movement and labor precautions reviewed.

## 2011-09-12 NOTE — Patient Instructions (Signed)
Pregnancy - Third Trimester The third trimester of pregnancy (the last 3 months) is a period of the most rapid growth for you and your baby. The baby approaches a length of 20 inches and a weight of 6 to 10 pounds. The baby is adding on fat and getting ready for life outside your body. While inside, babies have periods of sleeping and waking, suck their thumbs, and hiccups. You can often feel small contractions of the uterus. This is false labor. It is also called Braxton-Hicks contractions. This is like a practice for labor. The usual problems in this stage of pregnancy include more difficulty breathing, swelling of the hands and feet from water retention, and having to urinate more often because of the uterus and baby pressing on your bladder.  PRENATAL EXAMS  Blood work may continue to be done during prenatal exams. These tests are done to check on your health and the probable health of your baby. Blood work is used to follow your blood levels (hemoglobin). Anemia (low hemoglobin) is common during pregnancy. Iron and vitamins are given to help prevent this. You may also continue to be checked for diabetes. Some of the past blood tests may be done again.   The size of the uterus is measured during each visit. This makes sure your baby is growing properly according to your pregnancy dates.   Your blood pressure is checked every prenatal visit. This is to make sure you are not getting toxemia.   Your urine is checked every prenatal visit for infection, diabetes and protein.   Your weight is checked at each visit. This is done to make sure gains are happening at the suggested rate and that you and your baby are growing normally.   Sometimes, an ultrasound is performed to confirm the position and the proper growth and development of the baby. This is a test done that bounces harmless sound waves off the baby so your caregiver can more accurately determine due dates.   Discuss the type of pain  medication and anesthesia you will have during your labor and delivery.   Discuss the possibility and anesthesia if a Cesarean Section might be necessary.   Inform your caregiver if there is any mental or physical violence at home.  Sometimes, a specialized non-stress test, contraction stress test and biophysical profile are done to make sure the baby is not having a problem. Checking the amniotic fluid surrounding the baby is called an amniocentesis. The amniotic fluid is removed by sticking a needle into the belly (abdomen). This is sometimes done near the end of pregnancy if an early delivery is required. In this case, it is done to help make sure the baby's lungs are mature enough for the baby to live outside of the womb. If the lungs are not mature and it is unsafe to deliver the baby, an injection of cortisone medication is given to the mother 1 to 2 days before the delivery. This helps the baby's lungs mature and makes it safer to deliver the baby. CHANGES OCCURING IN THE THIRD TRIMESTER OF PREGNANCY Your body goes through many changes during pregnancy. They vary from person to person. Talk to your caregiver about changes you notice and are concerned about.  During the last trimester, you have probably had an increase in your appetite. It is normal to have cravings for certain foods. This varies from person to person and pregnancy to pregnancy.   You may begin to get stretch marks on your hips,   abdomen, and breasts. These are normal changes in the body during pregnancy. There are no exercises or medications to take which prevent this change.   Constipation may be treated with a stool softener or adding bulk to your diet. Drinking lots of fluids, fiber in vegetables, fruits, and whole grains are helpful.   Exercising is also helpful. If you have been very active up until your pregnancy, most of these activities can be continued during your pregnancy. If you have been less active, it is helpful  to start an exercise program such as walking. Consult your caregiver before starting exercise programs.   Avoid all smoking, alcohol, un-prescribed drugs, herbs and "street drugs" during your pregnancy. These chemicals affect the formation and growth of the baby. Avoid chemicals throughout the pregnancy to ensure the delivery of a healthy infant.   Backache, varicose veins and hemorrhoids may develop or get worse.   You will tire more easily in the third trimester, which is normal.   The baby's movements may be stronger and more often.   You may become short of breath easily.   Your belly button may stick out.   A yellow discharge may leak from your breasts called colostrum.   You may have a bloody mucus discharge. This usually occurs a few days to a week before labor begins.  HOME CARE INSTRUCTIONS   Keep your caregiver's appointments. Follow your caregiver's instructions regarding medication use, exercise, and diet.   During pregnancy, you are providing food for you and your baby. Continue to eat regular, well-balanced meals. Choose foods such as meat, fish, milk and other low fat dairy products, vegetables, fruits, and whole-grain breads and cereals. Your caregiver will tell you of the ideal weight gain.   A physical sexual relationship may be continued throughout pregnancy if there are no other problems such as early (premature) leaking of amniotic fluid from the membranes, vaginal bleeding, or belly (abdominal) pain.   Exercise regularly if there are no restrictions. Check with your caregiver if you are unsure of the safety of your exercises. Greater weight gain will occur in the last 2 trimesters of pregnancy. Exercising helps:   Control your weight.   Get you in shape for labor and delivery.   You lose weight after you deliver.   Rest a lot with legs elevated, or as needed for leg cramps or low back pain.   Wear a good support or jogging bra for breast tenderness during  pregnancy. This may help if worn during sleep. Pads or tissues may be used in the bra if you are leaking colostrum.   Do not use hot tubs, steam rooms, or saunas.   Wear your seat belt when driving. This protects you and your baby if you are in an accident.   Avoid raw meat, cat litter boxes and soil used by cats. These carry germs that can cause birth defects in the baby.   It is easier to loose urine during pregnancy. Tightening up and strengthening the pelvic muscles will help with this problem. You can practice stopping your urination while you are going to the bathroom. These are the same muscles you need to strengthen. It is also the muscles you would use if you were trying to stop from passing gas. You can practice tightening these muscles up 10 times a set and repeating this about 3 times per day. Once you know what muscles to tighten up, do not perform these exercises during urination. It is more likely   to cause an infection by backing up the urine.   Ask for help if you have financial, counseling or nutritional needs during pregnancy. Your caregiver will be able to offer counseling for these needs as well as refer you for other special needs.   Make a list of emergency phone numbers and have them available.   Plan on getting help from family or friends when you go home from the hospital.   Make a trial run to the hospital.   Take prenatal classes with the father to understand, practice and ask questions about the labor and delivery.   Prepare the baby's room/nursery.   Do not travel out of the city unless it is absolutely necessary and with the advice of your caregiver.   Wear only low or no heal shoes to have better balance and prevent falling.  MEDICATIONS AND DRUG USE IN PREGNANCY  Take prenatal vitamins as directed. The vitamin should contain 1 milligram of folic acid. Keep all vitamins out of reach of children. Only a couple vitamins or tablets containing iron may be fatal  to a baby or young child when ingested.   Avoid use of all medications, including herbs, over-the-counter medications, not prescribed or suggested by your caregiver. Only take over-the-counter or prescription medicines for pain, discomfort, or fever as directed by your caregiver. Do not use aspirin, ibuprofen (Motrin, Advil, Nuprin) or naproxen (Aleve) unless OK'd by your caregiver.   Let your caregiver also know about herbs you may be using.   Alcohol is related to a number of birth defects. This includes fetal alcohol syndrome. All alcohol, in any form, should be avoided completely. Smoking will cause low birth rate and premature babies.   Street/illegal drugs are very harmful to the baby. They are absolutely forbidden. A baby born to an addicted mother will be addicted at birth. The baby will go through the same withdrawal an adult does.  SEEK MEDICAL CARE IF: You have any concerns or worries during your pregnancy. It is better to call with your questions if you feel they cannot wait, rather than worry about them. DECISIONS ABOUT CIRCUMCISION You may or may not know the sex of your baby. If you know your baby is a boy, it may be time to think about circumcision. Circumcision is the removal of the foreskin of the penis. This is the skin that covers the sensitive end of the penis. There is no proven medical need for this. Often this decision is made on what is popular at the time or based upon religious beliefs and social issues. You can discuss these issues with your caregiver or pediatrician. SEEK IMMEDIATE MEDICAL CARE IF:   An unexplained oral temperature above 102 F (38.9 C) develops, or as your caregiver suggests.   You have leaking of fluid from the vagina (birth canal). If leaking membranes are suspected, take your temperature and tell your caregiver of this when you call.   There is vaginal spotting, bleeding or passing clots. Tell your caregiver of the amount and how many pads are  used.   You develop a bad smelling vaginal discharge with a change in the color from clear to white.   You develop vomiting that lasts more than 24 hours.   You develop chills or fever.   You develop shortness of breath.   You develop burning on urination.   You loose more than 2 pounds of weight or gain more than 2 pounds of weight or as suggested by your   caregiver.   You notice sudden swelling of your face, hands, and feet or legs.   You develop belly (abdominal) pain. Round ligament discomfort is a common non-cancerous (benign) cause of abdominal pain in pregnancy. Your caregiver still must evaluate you.   You develop a severe headache that does not go away.   You develop visual problems, blurred or double vision.   If you have not felt your baby move for more than 1 hour. If you think the baby is not moving as much as usual, eat something with sugar in it and lie down on your left side for an hour. The baby should move at least 4 to 5 times per hour. Call right away if your baby moves less than that.   You fall, are in a car accident or any kind of trauma.   There is mental or physical violence at home.  Document Released: 04/03/2001 Document Revised: 03/29/2011 Document Reviewed: 10/06/2008 [employer] Patient Information 2012 [employer]. 

## 2011-09-12 NOTE — Progress Notes (Signed)
Had some vaginal irritation a couple of days ago but it feels better now.

## 2011-09-26 ENCOUNTER — Ambulatory Visit (INDEPENDENT_AMBULATORY_CARE_PROVIDER_SITE_OTHER): Payer: Medicaid Other | Admitting: Obstetrics & Gynecology

## 2011-09-26 VITALS — BP 122/72 | Wt 158.0 lb

## 2011-09-26 DIAGNOSIS — Z3490 Encounter for supervision of normal pregnancy, unspecified, unspecified trimester: Secondary | ICD-10-CM

## 2011-09-26 DIAGNOSIS — O0930 Supervision of pregnancy with insufficient antenatal care, unspecified trimester: Secondary | ICD-10-CM

## 2011-09-26 DIAGNOSIS — IMO0002 Reserved for concepts with insufficient information to code with codable children: Secondary | ICD-10-CM

## 2011-09-26 DIAGNOSIS — O99330 Smoking (tobacco) complicating pregnancy, unspecified trimester: Secondary | ICD-10-CM

## 2011-09-26 DIAGNOSIS — Z3480 Encounter for supervision of other normal pregnancy, unspecified trimester: Secondary | ICD-10-CM

## 2011-09-26 DIAGNOSIS — B3731 Acute candidiasis of vulva and vagina: Secondary | ICD-10-CM

## 2011-09-26 DIAGNOSIS — N9089 Other specified noninflammatory disorders of vulva and perineum: Secondary | ICD-10-CM

## 2011-09-26 DIAGNOSIS — B373 Candidiasis of vulva and vagina: Secondary | ICD-10-CM

## 2011-09-26 NOTE — Progress Notes (Signed)
Complains of white vaginal discharge and vulvar irrtation not alleviated by Vagisil.  Denies any other symptoms.  On exam, thin white discharge seen, no odor.  Wet prep sample obtained.  Cervix closed.  Will follow up wet prep and manage accordignly.  No other complaints or concerns.  Fetal movement and labor precautions reviewed.

## 2011-09-26 NOTE — Patient Instructions (Signed)
Return to clinic for any obstetric concerns or go to [facility] for evaluation  

## 2011-09-27 LAB — WET PREP, GENITAL
Clue Cells Wet Prep HPF POC: NONE SEEN
Trich, Wet Prep: NONE SEEN

## 2011-09-27 MED ORDER — FLUCONAZOLE 150 MG PO TABS: 150.0000 mg | ORAL_TABLET | ORAL | Status: AC

## 2011-09-27 NOTE — Progress Notes (Signed)
Wet prep showed yeast.  Diflucan e-prescribed.  Patient will be called with results and advised to pick up medication.

## 2011-09-27 NOTE — Progress Notes (Signed)
Addended by: Jaynie Collins A on: 09/27/2011 07:50 AM   Modules accepted: Orders

## 2011-10-01 ENCOUNTER — Telehealth: Payer: Self-pay

## 2011-10-01 NOTE — Telephone Encounter (Signed)
This patient labs showed a slight yeast infection, we will be calling in some Dyflucan 150 mg to [facility].

## 2011-10-09 ENCOUNTER — Ambulatory Visit (INDEPENDENT_AMBULATORY_CARE_PROVIDER_SITE_OTHER): Payer: Medicaid Other | Admitting: Obstetrics and Gynecology

## 2011-10-09 VITALS — BP 122/72 | Wt 161.0 lb

## 2011-10-09 DIAGNOSIS — Z3490 Encounter for supervision of normal pregnancy, unspecified, unspecified trimester: Secondary | ICD-10-CM

## 2011-10-09 DIAGNOSIS — O0930 Supervision of pregnancy with insufficient antenatal care, unspecified trimester: Secondary | ICD-10-CM

## 2011-10-09 DIAGNOSIS — Z3480 Encounter for supervision of other normal pregnancy, unspecified trimester: Secondary | ICD-10-CM

## 2011-10-09 NOTE — Progress Notes (Signed)
Patient is here for routine prenatal check, she is doing well. 

## 2011-10-09 NOTE — Progress Notes (Signed)
Patient doing well without complaints. FM/PTL precautions reviewed 

## 2011-10-16 ENCOUNTER — Encounter: Payer: Self-pay | Admitting: Obstetrics & Gynecology

## 2011-10-16 ENCOUNTER — Ambulatory Visit (INDEPENDENT_AMBULATORY_CARE_PROVIDER_SITE_OTHER): Payer: Medicaid Other | Admitting: Obstetrics & Gynecology

## 2011-10-16 VITALS — BP 123/74 | Wt 161.0 lb

## 2011-10-16 DIAGNOSIS — Z3490 Encounter for supervision of normal pregnancy, unspecified, unspecified trimester: Secondary | ICD-10-CM

## 2011-10-16 DIAGNOSIS — Z3480 Encounter for supervision of other normal pregnancy, unspecified trimester: Secondary | ICD-10-CM

## 2011-10-16 NOTE — Progress Notes (Signed)
Routine visit. No problems. Good FM. Kick counts recommended. No VB, ROM, CTXs. PTL precautions given

## 2011-10-16 NOTE — Progress Notes (Signed)
Routine prenatal check, doing well. 

## 2011-10-23 ENCOUNTER — Ambulatory Visit (INDEPENDENT_AMBULATORY_CARE_PROVIDER_SITE_OTHER): Payer: Medicaid Other | Admitting: Family Medicine

## 2011-10-23 VITALS — BP 117/68 | Wt 161.0 lb

## 2011-10-23 DIAGNOSIS — Z3490 Encounter for supervision of normal pregnancy, unspecified, unspecified trimester: Secondary | ICD-10-CM

## 2011-10-23 DIAGNOSIS — Z3480 Encounter for supervision of other normal pregnancy, unspecified trimester: Secondary | ICD-10-CM

## 2011-10-23 LAB — OB RESULTS CONSOLE GBS: GBS: POSITIVE

## 2011-10-23 NOTE — Progress Notes (Signed)
Cultures today Labor precations

## 2011-10-23 NOTE — Patient Instructions (Addendum)
Breastfeeding BENEFITS OF BREASTFEEDING For the baby  The first milk (colostrum) helps the baby's digestive system function better.   There are antibodies from the mother in the milk that help the baby fight off infections.   The baby has a lower incidence of asthma, allergies, and SIDS (sudden infant death syndrome).   The nutrients in breast milk are better than formulas for the baby and helps the baby's brain grow better.   Babies who breastfeed have less gas, colic, and constipation.  For the mother  Breastfeeding helps develop a very special bond between mother and baby.   It is more convenient, always available at the correct temperature and cheaper than formula feeding.   It burns calories in the mother and helps with losing weight that was gained during pregnancy.   It makes the uterus contract back down to normal size faster and slows bleeding following delivery.   Breastfeeding mothers have a lower risk of developing breast cancer.  NURSE FREQUENTLY  A healthy, full-term baby may breastfeed as often as every hour or space his or her feedings to every 3 hours.   How often to nurse will vary from baby to baby. Watch your baby for signs of hunger, not the clock.   Nurse as often as the baby requests, or when you feel the need to reduce the fullness of your breasts.   Awaken the baby if it has been 3 to 4 hours since the last feeding.   Frequent feeding will help the mother make more milk and will prevent problems like sore nipples and engorgement of the breasts.  BABY'S POSITION AT THE BREAST  Whether lying down or sitting, be sure that the baby's tummy is facing your tummy.   Support the breast with 4 fingers underneath the breast and the thumb above. Make sure your fingers are well away from the nipple and baby's mouth.   Stroke the baby's lips and cheek closest to the breast gently with your finger or nipple.   When the baby's mouth is open wide enough, place  all of your nipple and as much of the dark area around the nipple as possible into your baby's mouth.   Pull the baby in close so the tip of the nose and the baby's cheeks touch the breast during the feeding.  FEEDINGS  The length of each feeding varies from baby to baby and from feeding to feeding.   The baby must suck about 2 to 3 minutes for your milk to get to him or her. This is called a "let down." For this reason, allow the baby to feed on each breast as long as he or she wants. Your baby will end the feeding when he or she has received the right balance of nutrients.   To break the suction, put your finger into the corner of the baby's mouth and slide it between his or her gums before removing your breast from his or her mouth. This will help prevent sore nipples.  REDUCING BREAST ENGORGEMENT  In the first week after your baby is born, you may experience signs of breast engorgement. When breasts are engorged, they feel heavy, warm, full, and may be tender to the touch. You can reduce engorgement if you:   Nurse frequently, every 2 to 3 hours. Mothers who breastfeed early and often have fewer problems with engorgement.   Place light ice packs on your breasts between feedings. This reduces swelling. Wrap the ice packs in a   lightweight towel to protect your skin.   Apply moist hot packs to your breast for 5 to 10 minutes before each feeding. This increases circulation and helps the milk flow.   Gently massage your breast before and during the feeding.   Make sure that the baby empties at least one breast at every feeding before switching sides.   Use a breast pump to empty the breasts if your baby is sleepy or not nursing well. You may also want to pump if you are returning to work or or you feel you are getting engorged.   Avoid bottle feeds, pacifiers or supplemental feedings of water or juice in place of breastfeeding.   Be sure the baby is latched on and positioned properly while  breastfeeding.   Prevent fatigue, stress, and anemia.   Wear a supportive bra, avoiding underwire styles.   Eat a balanced diet with enough fluids.  If you follow these suggestions, your engorgement should improve in 24 to 48 hours. If you are still experiencing difficulty, call your lactation consultant or caregiver. IS MY BABY GETTING ENOUGH MILK? Sometimes, mothers worry about whether their babies are getting enough milk. You can be assured that your baby is getting enough milk if:  The baby is actively sucking and you hear swallowing.   The baby nurses at least 8 to 12 times in a 24 hour time period. Nurse your baby until he or she unlatches or falls asleep at the first breast (at least 10 to 20 minutes), then offer the second side.   The baby is wetting 5 to 6 disposable diapers (6 to 8 cloth diapers) in a 24 hour period by 5 to 6 days of age.   The baby is having at least 2 to 3 stools every 24 hours for the first few months. Breast milk is all the food your baby needs. It is not necessary for your baby to have water or formula. In fact, to help your breasts make more milk, it is best not to give your baby supplemental feedings during the early weeks.   The stool should be soft and yellow.   The baby should gain 4 to 7 ounces per week after he is 4 days old.  TAKE CARE OF YOURSELF Take care of your breasts by:  Bathing or showering daily.   Avoiding the use of soaps on your nipples.   Start feedings on your left breast at one feeding and on your right breast at the next feeding.   You will notice an increase in your milk supply 2 to 5 days after delivery. You may feel some discomfort from engorgement, which makes your breasts very firm and often tender. Engorgement "peaks" out within 24 to 48 hours. In the meantime, apply warm moist towels to your breasts for 5 to 10 minutes before feeding. Gentle massage and expression of some milk before feeding will soften your breasts, making  it easier for your baby to latch on. Wear a well fitting nursing bra and air dry your nipples for 10 to 15 minutes after each feeding.   Only use cotton bra pads.   Only use pure lanolin on your nipples after nursing. You do not need to wash it off before nursing.  Take care of yourself by:   Eating well-balanced meals and nutritious snacks.   Drinking milk, fruit juice, and water to satisfy your thirst (about 8 glasses a day).   Getting plenty of rest.   Increasing calcium in   your diet (1200 mg a day).   Avoiding foods that you notice affect the baby in a bad way.  SEEK MEDICAL CARE IF:   You have any questions or difficulty with breastfeeding.   You need help.   You have a hard, red, sore area on your breast, accompanied by a fever of 100.5 F (38.1 C) or more.   Your baby is too sleepy to eat well or is having trouble sleeping.   Your baby is wetting less than 6 diapers per day, by 5 days of age.   Your baby's skin or white part of his or her eyes is more yellow than it was in the hospital.   You feel depressed.  Document Released: 04/09/2005 Document Revised: 03/29/2011 Document Reviewed: 11/22/2008 [employer] Patient Information 2012 [employer]. Normal Labor and Delivery Your caregiver must first be sure you are in labor. Signs of labor include:  You may pass what is called "the mucus plug" before labor begins. This is a small amount of blood stained mucus.   Regular uterine contractions.   The time between contractions get closer together.   The discomfort and pain gradually gets more intense.   Pains are mostly located in the back.   Pains get worse when walking.   The cervix (the opening of the uterus becomes thinner (begins to efface) and opens up (dilates).  Once you are in labor and admitted into the hospital or care center, your caregiver will do the following:  A complete physical examination.   Check your vital signs (blood pressure, pulse,  temperature and the fetal heart rate).   Do a vaginal examination (using a sterile glove and lubricant) to determine:   The position (presentation) of the baby (head [vertex] or buttock first).   The level (station) of the baby's head in the birth canal.   The effacement and dilatation of the cervix.   You may have your pubic hair shaved and be given an enema depending on your caregiver and the circumstance.   An electronic monitor is usually placed on your abdomen. The monitor follows the length and intensity of the contractions, as well as the baby's heart rate.   Usually, your caregiver will insert an IV in your arm with a bottle of sugar water. This is done as a precaution so that medications can be given to you quickly during labor or delivery.  NORMAL LABOR AND DELIVERY IS DIVIDED UP INTO 3 STAGES: First Stage This is when regular contractions begin and the cervix begins to efface and dilate. This stage can last from 3 to 15 hours. The end of the first stage is when the cervix is 100% effaced and 10 centimeters dilated. Pain medications may be given by   Injection (morphine, demerol, etc.)   Regional anesthesia (spinal, caudal or epidural, anesthetics given in different locations of the spine). Paracervical pain medication may be given, which is an injection of and anesthetic on each side of the cervix.  A pregnant woman may request to have "Natural Childbirth" which is not to have any medications or anesthesia during her labor and delivery. Second Stage This is when the baby comes down through the birth canal (vagina) and is born. This can take 1 to 4 hours. As the baby's head comes down through the birth canal, you may feel like you are going to have a bowel movement. You will get the urge to bear down and push until the baby is delivered. As the baby's   head is being delivered, the caregiver will decide if an episiotomy (a cut in the perineum and vagina area) is needed to prevent  tearing of the tissue in this area. The episiotomy is sewn up after the delivery of the baby and placenta. Sometimes a mask with nitrous oxide is given for the mother to breath during the delivery of the baby to help if there is too much pain. The end of Stage 2 is when the baby is fully delivered. Then when the umbilical cord stops pulsating it is clamped and cut. Third Stage The third stage begins after the baby is completely delivered and ends after the placenta (afterbirth) is delivered. This usually takes 5 to 30 minutes. After the placenta is delivered, a medication is given either by intravenous or injection to help contract the uterus and prevent bleeding. The third stage is not painful and pain medication is usually not necessary. If an episiotomy was done, it is repaired at this time. After the delivery, the mother is watched and monitored closely for 1 to 2 hours to make sure there is no postpartum bleeding (hemorrhage). If there is a lot of bleeding, medication is given to contract the uterus and stop the bleeding. Document Released: 01/17/2008 Document Revised: 03/29/2011 Document Reviewed: 01/17/2008 [employer] Patient Information 2012 [employer].  

## 2011-10-27 LAB — CULTURE, BETA STREP (GROUP B ONLY)

## 2011-10-29 ENCOUNTER — Encounter: Payer: Self-pay | Admitting: Family Medicine

## 2011-10-29 DIAGNOSIS — O99820 Streptococcus B carrier state complicating pregnancy: Secondary | ICD-10-CM

## 2011-10-29 HISTORY — DX: Streptococcus B carrier state complicating pregnancy: O99.820

## 2011-10-31 ENCOUNTER — Ambulatory Visit (INDEPENDENT_AMBULATORY_CARE_PROVIDER_SITE_OTHER): Payer: Medicaid Other | Admitting: Obstetrics & Gynecology

## 2011-10-31 VITALS — BP 122/82 | Wt 166.0 lb

## 2011-10-31 DIAGNOSIS — Z3490 Encounter for supervision of normal pregnancy, unspecified, unspecified trimester: Secondary | ICD-10-CM

## 2011-10-31 DIAGNOSIS — Z3480 Encounter for supervision of other normal pregnancy, unspecified trimester: Secondary | ICD-10-CM

## 2011-10-31 NOTE — Patient Instructions (Signed)

## 2011-10-31 NOTE — Progress Notes (Signed)
Mucus passing, occasional contractions. Labor precautions given

## 2011-11-06 ENCOUNTER — Ambulatory Visit (INDEPENDENT_AMBULATORY_CARE_PROVIDER_SITE_OTHER): Payer: Medicaid Other | Admitting: Obstetrics & Gynecology

## 2011-11-06 VITALS — BP 127/89 | Wt 164.0 lb

## 2011-11-06 DIAGNOSIS — Z3490 Encounter for supervision of normal pregnancy, unspecified, unspecified trimester: Secondary | ICD-10-CM

## 2011-11-06 DIAGNOSIS — O99820 Streptococcus B carrier state complicating pregnancy: Secondary | ICD-10-CM

## 2011-11-06 DIAGNOSIS — O0930 Supervision of pregnancy with insufficient antenatal care, unspecified trimester: Secondary | ICD-10-CM

## 2011-11-06 DIAGNOSIS — Z3480 Encounter for supervision of other normal pregnancy, unspecified trimester: Secondary | ICD-10-CM

## 2011-11-06 DIAGNOSIS — O09899 Supervision of other high risk pregnancies, unspecified trimester: Secondary | ICD-10-CM

## 2011-11-06 DIAGNOSIS — Z22330 Carrier of Group B streptococcus: Secondary | ICD-10-CM

## 2011-11-06 NOTE — Patient Instructions (Signed)
Return to clinic for any obstetric concerns or go to [facility] for evaluation  

## 2011-11-06 NOTE — Progress Notes (Signed)
DBP 89, will continue to monitor. No complaints or concerns.  Fetal movement and labor precautions reviewed.

## 2011-11-13 ENCOUNTER — Ambulatory Visit (INDEPENDENT_AMBULATORY_CARE_PROVIDER_SITE_OTHER): Payer: Medicaid Other | Admitting: Obstetrics & Gynecology

## 2011-11-13 VITALS — BP 132/81 | Wt 165.0 lb

## 2011-11-13 DIAGNOSIS — Z22330 Carrier of Group B streptococcus: Secondary | ICD-10-CM

## 2011-11-13 DIAGNOSIS — O09899 Supervision of other high risk pregnancies, unspecified trimester: Secondary | ICD-10-CM

## 2011-11-13 DIAGNOSIS — O0930 Supervision of pregnancy with insufficient antenatal care, unspecified trimester: Secondary | ICD-10-CM

## 2011-11-13 DIAGNOSIS — O99820 Streptococcus B carrier state complicating pregnancy: Secondary | ICD-10-CM

## 2011-11-13 NOTE — Progress Notes (Signed)
Membranes stripped.  No other complaints or concerns.  Fetal movement and labor precautions reviewed.

## 2011-11-20 ENCOUNTER — Ambulatory Visit (INDEPENDENT_AMBULATORY_CARE_PROVIDER_SITE_OTHER): Payer: Medicaid Other | Admitting: Obstetrics and Gynecology

## 2011-11-20 VITALS — BP 150/84 | Wt 167.0 lb

## 2011-11-20 DIAGNOSIS — Z3480 Encounter for supervision of other normal pregnancy, unspecified trimester: Secondary | ICD-10-CM

## 2011-11-20 DIAGNOSIS — O99330 Smoking (tobacco) complicating pregnancy, unspecified trimester: Secondary | ICD-10-CM

## 2011-11-20 DIAGNOSIS — O99820 Streptococcus B carrier state complicating pregnancy: Secondary | ICD-10-CM

## 2011-11-20 DIAGNOSIS — IMO0002 Reserved for concepts with insufficient information to code with codable children: Secondary | ICD-10-CM

## 2011-11-20 DIAGNOSIS — O09899 Supervision of other high risk pregnancies, unspecified trimester: Secondary | ICD-10-CM

## 2011-11-20 DIAGNOSIS — Z3490 Encounter for supervision of normal pregnancy, unspecified, unspecified trimester: Secondary | ICD-10-CM

## 2011-11-20 DIAGNOSIS — Z22330 Carrier of Group B streptococcus: Secondary | ICD-10-CM

## 2011-11-20 DIAGNOSIS — O0930 Supervision of pregnancy with insufficient antenatal care, unspecified trimester: Secondary | ICD-10-CM

## 2011-11-20 NOTE — Progress Notes (Signed)
Routine prenatal check, doing well, having a few contractions but nothing regular.

## 2011-11-20 NOTE — Progress Notes (Signed)
Patient doing well without complaints. FM/labor precautions reviewed. Cervical exam unchanged and membranes again stripped. Will schedule postdate fetal testing and IOL at 41 weeks

## 2011-11-21 ENCOUNTER — Encounter (HOSPITAL_COMMUNITY): Payer: Self-pay | Admitting: *Deleted

## 2011-11-21 ENCOUNTER — Telehealth (HOSPITAL_COMMUNITY): Payer: Self-pay | Admitting: *Deleted

## 2011-11-21 NOTE — Telephone Encounter (Signed)
Preadmission screen  

## 2011-11-23 ENCOUNTER — Ambulatory Visit (HOSPITAL_COMMUNITY)
Admission: RE | Admit: 2011-11-23 | Discharge: 2011-11-23 | Disposition: A | Discharge status: Home / Self Care | Payer: Medicaid Other | Source: Ambulatory Visit | Attending: Obstetrics and Gynecology | Admitting: Obstetrics and Gynecology

## 2011-11-23 DIAGNOSIS — Z3490 Encounter for supervision of normal pregnancy, unspecified, unspecified trimester: Secondary | ICD-10-CM

## 2011-11-23 DIAGNOSIS — O480 Post-term pregnancy: Secondary | ICD-10-CM | POA: Insufficient documentation

## 2011-11-23 DIAGNOSIS — Z3689 Encounter for other specified antenatal screening: Secondary | ICD-10-CM | POA: Insufficient documentation

## 2011-11-24 ENCOUNTER — Inpatient Hospital Stay (HOSPITAL_COMMUNITY)
Admission: AD | Admit: 2011-11-24 | Discharge: 2011-11-27 | DRG: 774 | Disposition: A | Discharge status: Home / Self Care | Payer: Medicaid Other | Source: Ambulatory Visit | Attending: Obstetrics & Gynecology | Admitting: Obstetrics & Gynecology

## 2011-11-24 ENCOUNTER — Inpatient Hospital Stay (HOSPITAL_COMMUNITY): Payer: Medicaid Other | Admitting: Anesthesiology

## 2011-11-24 ENCOUNTER — Encounter (HOSPITAL_COMMUNITY): Payer: Self-pay | Admitting: Anesthesiology

## 2011-11-24 ENCOUNTER — Encounter (HOSPITAL_COMMUNITY): Payer: Self-pay | Admitting: *Deleted

## 2011-11-24 DIAGNOSIS — O99820 Streptococcus B carrier state complicating pregnancy: Secondary | ICD-10-CM

## 2011-11-24 DIAGNOSIS — O411090 Infection of amniotic sac and membranes, unspecified, unspecified trimester, not applicable or unspecified: Secondary | ICD-10-CM | POA: Diagnosis present

## 2011-11-24 DIAGNOSIS — O0930 Supervision of pregnancy with insufficient antenatal care, unspecified trimester: Secondary | ICD-10-CM

## 2011-11-24 DIAGNOSIS — O99892 Other specified diseases and conditions complicating childbirth: Principal | ICD-10-CM | POA: Diagnosis present

## 2011-11-24 DIAGNOSIS — Z22330 Carrier of Group B streptococcus: Secondary | ICD-10-CM

## 2011-11-24 DIAGNOSIS — O411290 Chorioamnionitis, unspecified trimester, not applicable or unspecified: Secondary | ICD-10-CM

## 2011-11-24 DIAGNOSIS — O99330 Smoking (tobacco) complicating pregnancy, unspecified trimester: Secondary | ICD-10-CM

## 2011-11-24 DIAGNOSIS — Z3490 Encounter for supervision of normal pregnancy, unspecified, unspecified trimester: Secondary | ICD-10-CM

## 2011-11-24 DIAGNOSIS — IMO0001 Reserved for inherently not codable concepts without codable children: Secondary | ICD-10-CM

## 2011-11-24 LAB — CBC
MCH: 27.4 pg (ref 26.0–34.0)
MCHC: 32.7 g/dL (ref 30.0–36.0)
Platelets: 297 10*3/uL (ref 150–400)
RDW: 13.9 % (ref 11.5–15.5)

## 2011-11-24 LAB — POCT FERN TEST
Fern Test: NEGATIVE
Fern Test: POSITIVE

## 2011-11-24 MED ORDER — EPHEDRINE 5 MG/ML INJ
10.0000 mg | INTRAVENOUS | Status: DC | PRN
  Filled 2011-11-24: qty 4

## 2011-11-24 MED ORDER — OXYTOCIN BOLUS FROM INFUSION
250.0000 mL | Freq: Once | INTRAVENOUS | Status: DC
  Filled 2011-11-24: qty 500

## 2011-11-24 MED ORDER — PENICILLIN G POTASSIUM 5000000 UNITS IJ SOLR
2.5000 10*6.[IU] | INTRAVENOUS | Status: DC
  Administered 2011-11-24 – 2011-11-25 (×2): 2.5 10*6.[IU] via INTRAVENOUS
  Filled 2011-11-24 (×6): qty 2.5

## 2011-11-24 MED ORDER — PENICILLIN G POTASSIUM 5000000 UNITS IJ SOLR
5.0000 10*6.[IU] | Freq: Once | INTRAVENOUS | Status: DC
  Administered 2011-11-24: 5 10*6.[IU] via INTRAVENOUS
  Filled 2011-11-24: qty 5

## 2011-11-24 MED ORDER — CITRIC ACID-SODIUM CITRATE 334-500 MG/5ML PO SOLN
30.0000 mL | ORAL | Status: DC | PRN
  Administered 2011-11-24: 30 mL via ORAL
  Filled 2011-11-24: qty 15

## 2011-11-24 MED ORDER — IBUPROFEN 600 MG PO TABS
600.0000 mg | ORAL_TABLET | Freq: Four times a day (QID) | ORAL | Status: DC | PRN
  Administered 2011-11-25: 600 mg via ORAL
  Filled 2011-11-24: qty 1

## 2011-11-24 MED ORDER — PHENYLEPHRINE 40 MCG/ML (10ML) SYRINGE FOR IV PUSH (FOR BLOOD PRESSURE SUPPORT)
80.0000 ug | PREFILLED_SYRINGE | INTRAVENOUS | Status: DC | PRN
  Filled 2011-11-24: qty 5

## 2011-11-24 MED ORDER — LACTATED RINGERS IV SOLN
500.0000 mL | INTRAVENOUS | Status: DC | PRN
  Administered 2011-11-24: 1000 mL via INTRAVENOUS

## 2011-11-24 MED ORDER — LIDOCAINE HCL (PF) 1 % IJ SOLN
INTRAMUSCULAR | Status: DC | PRN
  Administered 2011-11-24 (×3): 4 mL

## 2011-11-24 MED ORDER — EPHEDRINE 5 MG/ML INJ: 10.0000 mg | INTRAVENOUS | Status: DC | PRN

## 2011-11-24 MED ORDER — OXYTOCIN 40 UNITS IN LACTATED RINGERS INFUSION - SIMPLE MED
1.0000 m[IU]/min | INTRAVENOUS | Status: DC
  Administered 2011-11-24: 2 m[IU]/min via INTRAVENOUS
  Filled 2011-11-24: qty 1000

## 2011-11-24 MED ORDER — LACTATED RINGERS IV SOLN
INTRAVENOUS | Status: DC
  Administered 2011-11-24: 125 mL/h via INTRAVENOUS
  Administered 2011-11-24 – 2011-11-25 (×2): via INTRAVENOUS

## 2011-11-24 MED ORDER — LIDOCAINE HCL (PF) 1 % IJ SOLN: 30.0000 mL | INTRAMUSCULAR | Status: DC | PRN

## 2011-11-24 MED ORDER — PHENYLEPHRINE 40 MCG/ML (10ML) SYRINGE FOR IV PUSH (FOR BLOOD PRESSURE SUPPORT): 80.0000 ug | PREFILLED_SYRINGE | INTRAVENOUS | Status: DC | PRN

## 2011-11-24 MED ORDER — LACTATED RINGERS IV SOLN: 500.0000 mL | Freq: Once | INTRAVENOUS | Status: DC

## 2011-11-24 MED ORDER — FENTANYL 2.5 MCG/ML BUPIVACAINE 1/10 % EPIDURAL INFUSION (WH - ANES)
14.0000 mL/h | INTRAMUSCULAR | Status: DC
  Administered 2011-11-24 – 2011-11-25 (×3): 14 mL/h via EPIDURAL
  Filled 2011-11-24 (×3): qty 60

## 2011-11-24 MED ORDER — FENTANYL CITRATE 0.05 MG/ML IJ SOLN: 100.0000 ug | INTRAMUSCULAR | Status: DC | PRN

## 2011-11-24 MED ORDER — ONDANSETRON HCL 4 MG/2ML IJ SOLN: 4.0000 mg | Freq: Four times a day (QID) | INTRAMUSCULAR | Status: DC | PRN

## 2011-11-24 MED ORDER — ACETAMINOPHEN 325 MG PO TABS
650.0000 mg | ORAL_TABLET | ORAL | Status: DC | PRN
  Administered 2011-11-25 (×2): 650 mg via ORAL
  Filled 2011-11-24 (×2): qty 2

## 2011-11-24 MED ORDER — TERBUTALINE SULFATE 1 MG/ML IJ SOLN: 0.2500 mg | Freq: Once | INTRAMUSCULAR | Status: AC | PRN

## 2011-11-24 MED ORDER — DIPHENHYDRAMINE HCL 50 MG/ML IJ SOLN
12.5000 mg | INTRAMUSCULAR | Status: DC | PRN
  Administered 2011-11-25: 12.5 mg via INTRAVENOUS
  Filled 2011-11-24: qty 1

## 2011-11-24 MED ORDER — FLEET ENEMA 7-19 GM/118ML RE ENEM: 1.0000 | ENEMA | RECTAL | Status: DC | PRN

## 2011-11-24 MED ORDER — OXYTOCIN 40 UNITS IN LACTATED RINGERS INFUSION - SIMPLE MED: 62.5000 mL/h | Freq: Once | INTRAVENOUS | Status: DC

## 2011-11-24 MED ORDER — OXYCODONE-ACETAMINOPHEN 5-325 MG PO TABS: 1.0000 | ORAL_TABLET | ORAL | Status: DC | PRN

## 2011-11-24 NOTE — MAU Note (Signed)
Contractions since 0530 this morning now 5 minutes apart stronger, [redacted]w[redacted]d, G4P2

## 2011-11-24 NOTE — Anesthesia Procedure Notes (Signed)
Epidural Patient location during procedure: OB Start time: 11/24/2011 7:47 PM Reason for block: procedure for pain  Staffing Performed by: anesthesiologist   Preanesthetic Checklist Completed: patient identified, site marked, surgical consent, pre-op evaluation, timeout performed, IV checked, risks and benefits discussed and monitors and equipment checked  Epidural Patient position: sitting Prep: site prepped and draped and DuraPrep Patient monitoring: continuous pulse ox and blood pressure Approach: midline Injection technique: LOR air  Needle:  Needle type: Tuohy  Needle gauge: 17 G Needle length: 9 cm Needle insertion depth: 5 cm cm Catheter type: closed end flexible Catheter size: 19 Gauge Catheter at skin depth: 10 cm Test dose: negative  Assessment Events: blood not aspirated, injection not painful, no injection resistance, negative IV test and no paresthesia  Additional Notes Discussed risk of headache, infection, bleeding, nerve injury and failed or incomplete block.  Patient voices understanding and wishes to proceed.

## 2011-11-24 NOTE — H&P (Signed)
Agree with note [name] [name]  

## 2011-11-24 NOTE — Progress Notes (Signed)
[name] [name] is a 21 y.o. [id number] at [redacted]w[redacted]d admitted for active labor, rupture of membranes  Subjective: Pt breathing with contractions, would like epidural at this time.  S/O in room for support.  Objective: BP 134/58  Pulse 98  Temp 97.9 F (36.6 C) (Oral)  Resp 18  Ht 5\' 1"  (1.549 m)  Wt 75.297 kg (166 lb)  BMI 31.37 kg/m2  LMP 02/14/2011      FHT:  FHR: 135 bpm, variability: moderate,  accelerations:  Present,  decelerations:  Absent UC:   regular, every 4-5 minutes, lasting 90-100 sec SVE:   Dilation: 5 Effacement (%): 90 Station: -2 Exam by:: [name] [name] cnm  Labs: Lab Results  Component Value Date   WBC 6.8 11/24/2011   HGB 12.8 11/24/2011   HCT 39.1 11/24/2011   MCV 83.7 11/24/2011   PLT 297 11/24/2011    Assessment / Plan: Spontaneous labor, progressing normally  Labor: Progressing normally  Plan to recheck after pt comfortable with epidural, may start Pitocin if needed. Preeclampsia:  N/A Fetal Wellbeing:  Category I Pain Control:  Labor support without medications I/D:  n/a Anticipated MOD:  NSVD  [name], [name] [name] 11/24/2011, 6:52 PM

## 2011-11-24 NOTE — MAU Note (Signed)
"  I started contracting this morning about 0930, but they became regular at about every 5 mins at 1200.  I had diarrhea and had some pink spotting."

## 2011-11-24 NOTE — Anesthesia Preprocedure Evaluation (Signed)
Anesthesia Evaluation  Patient identified by MRN, date of birth, ID band Patient awake    Reviewed: Allergy & Precautions, H&P , NPO status , Patient's Chart, lab work & pertinent test results, reviewed documented beta blocker date and time   History of Anesthesia Complications Negative for: history of anesthetic complications  Airway Mallampati: II TM Distance: >3 FB Neck ROM: full    Dental  (+) Teeth Intact and Chipped   Pulmonary former smoker,  breath sounds clear to auscultation        Cardiovascular negative cardio ROS  Rhythm:regular Rate:Normal     Neuro/Psych negative neurological ROS  negative psych ROS   GI/Hepatic negative GI ROS, Neg liver ROS,   Endo/Other  negative endocrine ROS  Renal/GU negative Renal ROS  negative genitourinary   Musculoskeletal   Abdominal   Peds  Hematology negative hematology ROS (+)   Anesthesia Other Findings   Reproductive/Obstetrics (+) Pregnancy                           Anesthesia Physical Anesthesia Plan  ASA: II  Anesthesia Plan: Epidural   Post-op Pain Management:    Induction:   Airway Management Planned:   Additional Equipment:   Intra-op Plan:   Post-operative Plan:   Informed Consent: I have reviewed the patients History and Physical, chart, labs and discussed the procedure including the risks, benefits and alternatives for the proposed anesthesia with the patient or authorized representative who has indicated his/her understanding and acceptance.     Plan Discussed with:   Anesthesia Plan Comments:         Anesthesia Quick Evaluation

## 2011-11-24 NOTE — Progress Notes (Signed)
[name] [name] is a 21 y.o. [id number] at [redacted]w[redacted]d admitted for active labor, rupture of membranes  Subjective: Pt comfortable with epidural.  Family at bedside for support.  Objective: BP 136/88  Pulse 120  Temp 99 F (37.2 C) (Oral)  Resp 18  Ht 5\' 1"  (1.549 m)  Wt 75.297 kg (166 lb)  BMI 31.37 kg/m2  SpO2 100%  LMP 02/14/2011      FHT:  FHR: 135 bpm, variability: moderate,  accelerations:  Present,  decelerations:  Absent UC:   irregular, occasional SVE:   Dilation: 5 Effacement (%): 90 Station: -2 Exam by:: Collene Gobble CNM  Labs: Lab Results  Component Value Date   WBC 6.8 11/24/2011   HGB 12.8 11/24/2011   HCT 39.1 11/24/2011   MCV 83.7 11/24/2011   PLT 297 11/24/2011    Assessment / Plan: Protracted active phase SROM  Labor: Plan to start Pitocin Preeclampsia:  N/A Fetal Wellbeing:  Category I Pain Control:  Epidural I/D:  n/a Anticipated MOD:  NSVD  LEFTWICH-KIRBY, [name] [name] 11/24/2011, 10:07 PM

## 2011-11-24 NOTE — H&P (Signed)
[name] [name] is a 21 y.o. female presenting for labor evaluation.  She reports contractions started at 9 am today and became progressively more uncomfortable today.  She felt a small gush of fluid around 2 pm and has some scant leakage described as P[place] since then.  She reports good fetal movement, denies vaginal bleeding, vaginal itching/burning, urinary symptoms, h/a, dizziness, n/v, or fever/chills.    Maternal Medical History:  Reason for admission: Reason for admission: rupture of membranes and contractions.  Reason for Admission:   nauseaContractions: Onset was 6-12 hours ago.   Frequency: regular.   Duration is approximately 1 minute.   Perceived severity is moderate.    Fetal activity: Perceived fetal activity is normal.   Last perceived fetal movement was within the past hour.    Prenatal complications: no prenatal complications Prenatal Complications - Diabetes: none. 1 hour GTT 78  OB History    Grav Para Term Preterm Abortions TAB SAB Ect Mult Living   4 2 2  1  1   2      Past Medical History  Diagnosis Date  . Pregnancy induced hypertension 2009   Past Surgical History  Procedure Date  . Dilation and curettage of uterus    Family History: family history is negative for Anesthesia problems, and Hypotension, and Malignant hyperthermia, and Pseudochol deficiency, . Social History:  reports that she quit smoking about 2 months ago. She has never used smokeless tobacco. She reports that she does not drink alcohol or use illicit drugs.   Prenatal Transfer Tool  Maternal Diabetes: No Genetic Screening: Normal Maternal Ultrasounds/Referrals: Normal Fetal Ultrasounds or other Referrals:  None Maternal Substance Abuse:  No Significant Maternal Medications:  None Significant Maternal Lab Results:  Lab values include: Group B Strep positive Other Comments:  None  Review of Systems  Constitutional: Negative for fever, chills and malaise/fatigue.  Eyes: Negative for  blurred vision.  Respiratory: Negative for cough and shortness of breath.   Cardiovascular: Negative for chest pain.  Gastrointestinal: Positive for abdominal pain. Negative for heartburn, nausea and vomiting.  Genitourinary: Negative for dysuria, urgency and frequency.  Musculoskeletal: Negative.   Neurological: Negative for dizziness and headaches.  Psychiatric/Behavioral: Negative for depression.    Dilation: 5 Effacement (%): 80 Station: -2 Exam by:: Sharen Counter, CNM Blood pressure 134/74, pulse 93, temperature 98.3 F (36.8 C), temperature source Oral, resp. rate 16, height 5\' 1"  (1.549 m), weight 75.297 kg (166 lb), last menstrual period 02/14/2011. Maternal Exam:  Uterine Assessment: Contraction strength is moderate.  Contraction duration is 90 seconds. Contraction frequency is regular.   Abdomen: Patient reports no abdominal tenderness. Fundal height is 38.   Estimated fetal weight is 6.5lbs by Leopolds.   Fetal presentation: vertex  Introitus: Ferning test: positive.  Amniotic fluid character: clear.  Cervix: Cervix evaluated by digital exam.     Fetal Exam Fetal Monitor Review: Mode: ultrasound.   Variability: moderate (6-25 bpm).   Pattern: accelerations present.    Fetal State Assessment: Category I - tracings are normal.     Physical Exam  Nursing note and vitals reviewed. Constitutional: She is oriented to person, place, and time. She appears well-developed and well-nourished.  Neck: Normal range of motion.  Cardiovascular: Normal rate, regular rhythm and normal heart sounds.   Respiratory: Effort normal.  GI: Soft.  Genitourinary:       Pelvic exam: equivacal pooling of clear fluid/mucous-like discharge  Musculoskeletal: Normal range of motion.  Neurological: She is alert and oriented  to person, place, and time. She has normal reflexes.  Skin: Skin is warm and dry.  Psychiatric: She has a normal mood and affect. Her behavior is normal.  Judgment and thought content normal.    Prenatal labs: ABO, Rh: O/POS/-- (04/15 1027) Antibody: NEG (04/15 1027) Rubella: 144.5 (04/15 1027) RPR: NON REAC (05/08 1125)  HBsAg: NEGATIVE (04/15 1027)  HIV: NON REACTIVE (05/08 1125)  GBS: Positive (07/02 0000)   Assessment/Plan: Active labor SROM GBS positive  Admit to birthing suites PCN for GBS prophylaxis Anticipate NSVD  LEFTWICH-KIRBY, Justiss Gerbino 11/24/2011, 3:39 PM

## 2011-11-25 ENCOUNTER — Encounter (HOSPITAL_COMMUNITY): Payer: Self-pay | Admitting: *Deleted

## 2011-11-25 DIAGNOSIS — O411290 Chorioamnionitis, unspecified trimester, not applicable or unspecified: Secondary | ICD-10-CM

## 2011-11-25 DIAGNOSIS — O9989 Other specified diseases and conditions complicating pregnancy, childbirth and the puerperium: Secondary | ICD-10-CM

## 2011-11-25 DIAGNOSIS — O411090 Infection of amniotic sac and membranes, unspecified, unspecified trimester, not applicable or unspecified: Secondary | ICD-10-CM

## 2011-11-25 HISTORY — DX: Chorioamnionitis, unspecified trimester, not applicable or unspecified: O41.1290

## 2011-11-25 LAB — RPR: RPR Ser Ql: NONREACTIVE

## 2011-11-25 MED ORDER — DIPHENHYDRAMINE HCL 25 MG PO CAPS: 25.0000 mg | ORAL_CAPSULE | Freq: Four times a day (QID) | ORAL | Status: DC | PRN

## 2011-11-25 MED ORDER — DEXTROSE 5 % IV SOLN
150.0000 mg | Freq: Three times a day (TID) | INTRAVENOUS | Status: DC
  Administered 2011-11-25: 150 mg via INTRAVENOUS
  Filled 2011-11-25 (×3): qty 3.75

## 2011-11-25 MED ORDER — DIBUCAINE 1 % RE OINT
1.0000 | TOPICAL_OINTMENT | RECTAL | Status: DC | PRN
Start: 2011-11-25 — End: 2011-11-27

## 2011-11-25 MED ORDER — ZOLPIDEM TARTRATE 5 MG PO TABS: 5.0000 mg | ORAL_TABLET | Freq: Every evening | ORAL | Status: DC | PRN

## 2011-11-25 MED ORDER — SENNOSIDES-DOCUSATE SODIUM 8.6-50 MG PO TABS
2.0000 | ORAL_TABLET | Freq: Every day | ORAL | Status: DC
  Administered 2011-11-26: 2 via ORAL

## 2011-11-25 MED ORDER — ONDANSETRON HCL 4 MG PO TABS: 4.0000 mg | ORAL_TABLET | ORAL | Status: DC | PRN

## 2011-11-25 MED ORDER — ONDANSETRON HCL 4 MG/2ML IJ SOLN: 4.0000 mg | INTRAMUSCULAR | Status: DC | PRN

## 2011-11-25 MED ORDER — IBUPROFEN 600 MG PO TABS
600.0000 mg | ORAL_TABLET | Freq: Four times a day (QID) | ORAL | Status: DC
  Administered 2011-11-25 – 2011-11-27 (×9): 600 mg via ORAL
  Filled 2011-11-25 (×9): qty 1

## 2011-11-25 MED ORDER — OXYCODONE-ACETAMINOPHEN 5-325 MG PO TABS
1.0000 | ORAL_TABLET | ORAL | Status: DC | PRN
  Administered 2011-11-25 – 2011-11-26 (×5): 1 via ORAL
  Filled 2011-11-25 (×5): qty 1

## 2011-11-25 MED ORDER — WITCH HAZEL-GLYCERIN EX PADS
1.0000 | MEDICATED_PAD | CUTANEOUS | Status: DC | PRN
Start: 2011-11-25 — End: 2011-11-27

## 2011-11-25 MED ORDER — SODIUM CHLORIDE 0.9 % IV SOLN
2.0000 g | Freq: Four times a day (QID) | INTRAVENOUS | Status: DC
  Administered 2011-11-25: 2 g via INTRAVENOUS
  Filled 2011-11-25 (×3): qty 2000

## 2011-11-25 MED ORDER — BENZOCAINE-MENTHOL 20-0.5 % EX AERO: 1.0000 "application " | INHALATION_SPRAY | CUTANEOUS | Status: DC | PRN

## 2011-11-25 MED ORDER — PRENATAL MULTIVITAMIN CH
1.0000 | ORAL_TABLET | Freq: Every day | ORAL | Status: DC
  Administered 2011-11-25 – 2011-11-27 (×3): 1 via ORAL
  Filled 2011-11-25 (×3): qty 1

## 2011-11-25 MED ORDER — LANOLIN HYDROUS EX OINT: TOPICAL_OINTMENT | CUTANEOUS | Status: DC | PRN

## 2011-11-25 MED ORDER — SIMETHICONE 80 MG PO CHEW: 80.0000 mg | CHEWABLE_TABLET | ORAL | Status: DC | PRN

## 2011-11-25 MED ORDER — TETANUS-DIPHTH-ACELL PERTUSSIS 5-2.5-18.5 LF-MCG/0.5 IM SUSP
0.5000 mL | Freq: Once | INTRAMUSCULAR | Status: AC
  Administered 2011-11-26: 0.5 mL via INTRAMUSCULAR
  Filled 2011-11-25: qty 0.5

## 2011-11-25 NOTE — Progress Notes (Signed)
Pt called out reporting ROM;  Pt assessed and is still intact.  Pericare completed

## 2011-11-25 NOTE — Consult Note (Signed)
ANTIBIOTIC CONSULT NOTE - INITIAL  Pharmacy Consult for Gentamicin Indication: Chorioamnionitis   Patient Measurements: Height: 5\' 1"  (154.9 cm) Weight: 166 lb (75.297 kg) IBW/kg (Calculated) : 47.8  Adjusted Body Weight:56.1 kg Vital Signs: Temp: 100.8 F (38.2 C) (08/04 0001) Temp src: Oral (08/04 0001) BP: 134/73 mmHg (08/04 0041) Pulse Rate: 100  (08/04 0104) Intake/Output from previous day:   Intake/Output from this shift:    Labs:  Basename 11/24/11 1635  WBC 6.8  HGB 12.8  PLT 297  LABCREA --  CREATININE --   Estimated Creatinine Clearance: 101.5 ml/min (by C-G formula based on Cr of 0.59). No results found for this basename: VANCOTROUGH:2,VANCOPEAK:2,VANCORANDOM:2,GENTTROUGH:2,GENTPEAK:2,GENTRANDOM:2,TOBRATROUGH:2,TOBRAPEAK:2,TOBRARND:2,AMIKACINPEAK:2,AMIKACINTROU:2,AMIKACIN:2, in the last 72 hours   Microbiology: No results found for this or any previous visit (from the past 720 hour(s)).  Medical History: Past Medical History  Diagnosis Date  . Pregnancy induced hypertension 2009    Medications:  Penicillin per GBS protocol x 2 doses (discontinued) Ampicillin 2 Gm IV every 6 hours  Assessment: This pt is a 21 yo G4P2012 in labor;  now with increased temp and presumed chorioamnionitis   Goal of Therapy:  Gentamicin peaks 6-8 mcg/ml; troughs < 1 mcg/ml   Plan:  Gentamicin 150 mg IV every 8 hours Serum creatinine with next lab draw if gentamicin is continued Serum gentamicin levels as indicated   Arelia Sneddon 11/25/2011,1:30 AM

## 2011-11-25 NOTE — Anesthesia Postprocedure Evaluation (Signed)
  Anesthesia Post-op Note  Patient: [name] [name]  Procedure(s) Performed: * No surgery found *  Patient Location: Mother/Baby  Anesthesia Type: Epidural  Level of Consciousness: awake, alert  and oriented  Airway and Oxygen Therapy: Patient Spontanous Breathing  Post-op Pain: mild  Post-op Assessment: Patient's Cardiovascular Status Stable and Respiratory Function Stable  Post-op Vital Signs: stable  Complications: No apparent anesthesia complications

## 2011-11-25 NOTE — Progress Notes (Signed)
[name] [name] is a 21 y.o. [id number] at [redacted]w[redacted]d by admitted for active labor, rupture of membranes.  Subjective: Pt comfortable with epidural.  S/o in room for support.  Objective: BP 134/73  Pulse 100  Temp 100.8 F (38.2 C) (Oral)  Resp 20  Ht 5\' 1"  (1.549 m)  Wt 75.297 kg (166 lb)  BMI 31.37 kg/m2  SpO2 100%  LMP 02/14/2011      FHT:  FHR: 145 bpm, variability: moderate,  accelerations:  Present,  decelerations:  Present early UC:   regular, every 4 minutes SVE:   Dilation: 6-7 Effacement (%): 90 Station: -2 Exam by: Courtney Paris, CNM Forebag ruptured, clear fluid noted  Labs: Lab Results  Component Value Date   WBC 6.8 11/24/2011   HGB 12.8 11/24/2011   HCT 39.1 11/24/2011   MCV 83.7 11/24/2011   PLT 297 11/24/2011    Assessment / Plan: Augmentation of labor, progressing well Chorioamnionitis with maternal temp of 100.8  Ampicillin and Gentamycin IV started  Labor: Progressing normally Preeclampsia:  N/A Fetal Wellbeing:  Category I Pain Control:  Epidural I/D:  n/a Anticipated MOD:  NSVD  [name], [name] [name] 11/25/2011, 1:35 AM

## 2011-11-25 NOTE — Progress Notes (Signed)
RN at bedside, pt shaking vigorously with complaints of dizziness and shortness of breath, repirations 36 and O2 sat 79%, O2 applied via non-rebreather face mask, RROB called to bedside for further evaluation, pt status improved with O2 and warm blankets, temp 102.6 axillary will administer Tylenol and Ibuprofen

## 2011-11-26 ENCOUNTER — Encounter: Payer: Medicaid Other | Admitting: Family Medicine

## 2011-11-26 LAB — CBC
MCHC: 33.3 g/dL (ref 30.0–36.0)
Platelets: 250 10*3/uL (ref 150–400)
RDW: 14.1 % (ref 11.5–15.5)
WBC: 11 10*3/uL — ABNORMAL HIGH (ref 4.0–10.5)

## 2011-11-26 MED ORDER — IBUPROFEN 600 MG PO TABS: 600.0000 mg | ORAL_TABLET | Freq: Four times a day (QID) | ORAL | Status: AC | PRN

## 2011-11-26 NOTE — Discharge Summary (Signed)
Yohan Samons L. Harraway-Smith, M.D., FACOG  

## 2011-11-26 NOTE — Progress Notes (Signed)
UR chart review completed.  

## 2011-11-26 NOTE — Discharge Summary (Addendum)
Obstetric Discharge Summary Reason for Admission: onset of labor Prenatal Procedures: none Intrapartum Procedures: spontaneous vaginal delivery and Amp/Gent  Postpartum Procedures: antibiotics Complications-Operative and Postpartum: none Hemoglobin  Date Value Range Status  11/26/2011 11.8* 12.0 - 15.0 g/dL Final     HCT  Date Value Range Status  11/26/2011 35.4* 36.0 - 46.0 % Final    Physical Exam:  General: alert and cooperative Lochia: appropriate Uterine Fundus: firm Incision: n/a DVT Evaluation: No evidence of DVT seen on physical exam. Negative Homan's sign. No cords or calf tenderness. No significant calf/ankle edema.  Discharge Diagnoses: Term Pregnancy-delivered  Discharge Information: Date: 11/26/2011 Activity: unrestricted Diet: routine Medications: Ibuprofen Condition: stable Instructions: refer to practice specific booklet Discharge to: home Follow-up Information    Schedule an appointment as soon as possible for a visit with [facility].   Contact information:   [address] [place] [address] [phone]        Implanon and bottle feeding  Newborn Data: Live born female  Birth Weight: 6 lb 13.4 oz (3100 g) APGAR: 8, 9  Home with mother.  Marikay Alar 11/26/2011, 7:20 AM  I examined pt and agree with documentation above and resident plan of care. [facility]  Baby required to stay another day r/t chorio during labor.  Pt offered to be discharged as baby patient but requested to stay inpatient until 11/27/11.  Assessment on 11/27/11 unchanged from yesterday, with lochia scant and uterus firm u-1.  Pt reports pain well controlled with ibuprofen.    Sharen Counter Certified Nurse-Midwife

## 2011-11-27 NOTE — Discharge Summary (Signed)
Agree with above note.  Nara Paternoster H. 11/27/2011 6:50 AM

## 2011-11-28 ENCOUNTER — Inpatient Hospital Stay (HOSPITAL_COMMUNITY): Admission: RE | Admit: 2011-11-28 | Payer: Medicaid Other | Source: Ambulatory Visit

## 2012-01-08 ENCOUNTER — Encounter: Payer: Self-pay | Admitting: Obstetrics & Gynecology

## 2012-01-08 ENCOUNTER — Ambulatory Visit (INDEPENDENT_AMBULATORY_CARE_PROVIDER_SITE_OTHER): Payer: Medicaid Other | Admitting: Obstetrics & Gynecology

## 2012-01-08 NOTE — Progress Notes (Signed)
Patient is doing well, having some vaginal irritation and is interested in the Nexplanon for contraception.

## 2012-01-08 NOTE — Patient Instructions (Signed)
Return to clinic for any scheduled appointments or for any gynecologic concerns as needed.   

## 2012-01-08 NOTE — Progress Notes (Signed)
  Subjective:     [name] [name] is a 21 y.o. [phone] female who presents for a postpartum visit. She is 6 weeks postpartum following a spontaneous vaginal delivery. I have fully reviewed the prenatal and intrapartum course. The delivery was at 40 gestational weeks. Outcome: spontaneous vaginal delivery. Anesthesia: epidural. Postpartum course has been uncomplicated. Baby's course has been uncomplicated. Baby is feeding by bottle. Bleeding: no bleeding. Bowel function is normal. Bladder function is normal. Patient is sexually active. Contraception method is none currently, desires Nexplanon. LMP 01/01/12. Postpartum depression screening: negative.  Patient reports having burning during urination because of  "paper cuts" on her vulva. She had no lacerations during delivery and wants to know where the cuts came from.  The following portions of the patient's history were reviewed and updated as appropriate: allergies, current medications, past family history, past medical history, past social history, past surgical history and problem list.  Review of Systems Pertinent items are noted in HPI.   Objective:    BP 110/79  Pulse 74  Ht 5\' 1"  (1.549 m)  Wt 149 lb (67.586 kg)  BMI 28.15 kg/m2  Breastfeeding? No  General:  alert and no distress   Breasts:  inspection negative, no nipple discharge or bleeding, no masses or nodularity palpable  Lungs: clear to auscultation bilaterally  Heart:  regular rate and rhythm  Abdomen: soft, non-tender; bowel sounds normal; no masses,  no organomegaly   Vulva: Patient has superficial longitudinal cracks (about 3 cm in length) in between labia minor and labia majora bilaterally, no ulcers, no lesions. No erythema, no tenderness.  Vulva noted to be dry on appearance  Vagina: normal vagina, no discharge, exudate, lesion, or erythema  Cervix:  multiparous appearance and no cervical motion tenderness  Corpus: normal size, contour, position, consistency, mobility,  non-tender  Adnexa:  normal adnexa and no mass, fullness, tenderness  Rectal Exam: Not performed.        Assessment:    Normal postpartum exam. Pap smear not done at today's visit, due after 07/2012.   Lacerations secondary to vulvar dryness, postpartum hormonal side effect. No evidence of infection.  Plan:    1. Contraception: Nexplanon. Patient will return in a couple of days for placement, handout given to patient. Told to avoid intercourse or only have protected intercourse. 2. Vulvar lacerations: Recommended application of OTC hydrocortisone cream, will monitor response 3. Follow up in: 2 days for Nexplanon placement, after 07/2012 for annual  exam or as needed.

## 2012-01-17 ENCOUNTER — Encounter: Payer: Self-pay | Admitting: Obstetrics & Gynecology

## 2012-01-17 ENCOUNTER — Ambulatory Visit (INDEPENDENT_AMBULATORY_CARE_PROVIDER_SITE_OTHER): Payer: Medicaid Other | Admitting: Obstetrics & Gynecology

## 2012-01-17 VITALS — BP 109/77 | HR 81 | Ht 61.0 in | Wt 148.0 lb

## 2012-01-17 DIAGNOSIS — Z30430 Encounter for insertion of intrauterine contraceptive device: Secondary | ICD-10-CM

## 2012-01-17 DIAGNOSIS — Z23 Encounter for immunization: Secondary | ICD-10-CM

## 2012-01-17 DIAGNOSIS — Z01812 Encounter for preprocedural laboratory examination: Secondary | ICD-10-CM

## 2012-01-17 DIAGNOSIS — IMO0001 Reserved for inherently not codable concepts without codable children: Secondary | ICD-10-CM

## 2012-01-17 NOTE — Addendum Note (Signed)
Addended by: Nicholaus Bloom C on: 01/17/2012 10:30 AM   Modules accepted: Orders

## 2012-01-17 NOTE — Progress Notes (Signed)
  Subjective:    Patient ID: [name] [name], female    DOB: [birth year], 20 y.o.   MRN: [id number]  HPI  [name] [name] is here to have LARC. Initially she came here for Nexplanon but after a discussion of her options, she has decided to have the Mirena. She understands that several months of irregular bleeding is to be expected.  Review of Systems Pap smear normal 4/13   Objective:   Physical Exam  A wet prep was done at her request.  Her cervix was prepped with betadine. UPT was negative, consent was signed. A Mirena was easily placed. The strings were cut to 4 cm. She tolerated the procedure well.      Assessment & Plan:  Birth control - Mirena Preventative care- start Gardasil today, Flu vaccine today RTC 2 month for Gardasil #2 and string check

## 2012-01-18 LAB — WET PREP, GENITAL
Clue Cells Wet Prep HPF POC: NONE SEEN
Trich, Wet Prep: NONE SEEN

## 2012-01-28 ENCOUNTER — Telehealth: Payer: Self-pay | Admitting: Gynecology

## 2012-01-28 DIAGNOSIS — B379 Candidiasis, unspecified: Secondary | ICD-10-CM

## 2012-01-28 MED ORDER — FLUCONAZOLE 150 MG PO TABS: 150.0000 mg | ORAL_TABLET | Freq: Once | ORAL | Status: DC

## 2012-01-28 NOTE — Telephone Encounter (Signed)
Patient call for result of wet prep. Result given. Rx was send to patient pharmacy.

## 2012-03-24 ENCOUNTER — Ambulatory Visit: Payer: Medicaid Other

## 2012-08-08 ENCOUNTER — Emergency Department: Payer: Self-pay | Admitting: Emergency Medicine

## 2012-08-08 LAB — COMPREHENSIVE METABOLIC PANEL
Albumin: 4 g/dL (ref 3.4–5.0)
Alkaline Phosphatase: 70 U/L (ref 50–136)
Anion Gap: 5 — ABNORMAL LOW (ref 7–16)
BUN: 11 mg/dL (ref 7–18)
Chloride: 110 mmol/L — ABNORMAL HIGH (ref 98–107)
Co2: 25 mmol/L (ref 21–32)
EGFR (African American): >60
EGFR (Non-African Amer.): >60
Osmolality: 279 (ref 275–301)
Sodium: 140 mmol/L (ref 136–145)

## 2012-08-08 LAB — CBC
HGB: 14.3 g/dL (ref 12.0–16.0)
MCH: 28.7 pg (ref 26.0–34.0)
RDW: 13.5 % (ref 11.5–14.5)
WBC: 6.8 10*3/uL (ref 3.6–11.0)

## 2012-08-08 LAB — DRUG SCREEN, URINE
Amphetamines, Ur Screen: NEGATIVE (ref ?–1000)
Barbiturates, Ur Screen: NEGATIVE (ref ?–200)
MDMA (Ecstasy)Ur Screen: NEGATIVE (ref ?–500)
Methadone, Ur Screen: NEGATIVE (ref ?–300)
Phencyclidine (PCP) Ur S: NEGATIVE (ref ?–25)

## 2014-01-31 ENCOUNTER — Emergency Department: Payer: Self-pay | Admitting: Emergency Medicine

## 2014-01-31 LAB — COMPREHENSIVE METABOLIC PANEL
ALK PHOS: 51 U/L
ALT: 50 U/L
AST: 53 U/L — AB (ref 15–37)
Albumin: 3.7 g/dL (ref 3.4–5.0)
Anion Gap: 8 (ref 7–16)
BUN: 12 mg/dL (ref 7–18)
Bilirubin,Total: 0.3 mg/dL (ref 0.2–1.0)
CREATININE: 1.03 mg/dL (ref 0.60–1.30)
Calcium, Total: 8.1 mg/dL — ABNORMAL LOW (ref 8.5–10.1)
Chloride: 100 mmol/L (ref 98–107)
Co2: 24 mmol/L (ref 21–32)
EGFR (African American): >60
EGFR (Non-African Amer.): >60
Glucose: 118 mg/dL — ABNORMAL HIGH (ref 65–99)
Osmolality: 265 (ref 275–301)
Potassium: 3.7 mmol/L (ref 3.5–5.1)
Sodium: 132 mmol/L — ABNORMAL LOW (ref 136–145)
TOTAL PROTEIN: 7.9 g/dL (ref 6.4–8.2)

## 2014-01-31 LAB — URINALYSIS, COMPLETE
Bilirubin,UR: NEGATIVE
Blood: NEGATIVE
Glucose,UR: NEGATIVE mg/dL (ref 0–75)
NITRITE: NEGATIVE
PH: 5 (ref 4.5–8.0)
Protein: 100
RBC,UR: 1 /HPF (ref 0–5)
Specific Gravity: 1.028 (ref 1.003–1.030)
Squamous Epithelial: 26
WBC UR: 6 /HPF (ref 0–5)

## 2014-01-31 LAB — CBC
HCT: 42.5 % (ref 35.0–47.0)
HGB: 13.8 g/dL (ref 12.0–16.0)
MCH: 27.8 pg (ref 26.0–34.0)
MCHC: 32.4 g/dL (ref 32.0–36.0)
MCV: 86 fL (ref 80–100)
PLATELETS: 116 10*3/uL — AB (ref 150–440)
RBC: 4.95 10*6/uL (ref 3.80–5.20)
RDW: 13.1 % (ref 11.5–14.5)
WBC: 2.5 10*3/uL — AB (ref 3.6–11.0)

## 2014-01-31 LAB — WET PREP, GENITAL

## 2014-01-31 LAB — GC/CHLAMYDIA PROBE AMP

## 2014-01-31 LAB — LIPASE, BLOOD: LIPASE: 74 U/L (ref 73–393)

## 2014-01-31 LAB — HCG, QUANTITATIVE, PREGNANCY: Beta Hcg, Quant.: <1 m[IU]/mL — ABNORMAL LOW

## 2014-02-01 ENCOUNTER — Emergency Department: Payer: Self-pay | Admitting: Emergency Medicine

## 2014-02-22 ENCOUNTER — Encounter: Payer: Self-pay | Admitting: Obstetrics & Gynecology

## 2014-08-19 ENCOUNTER — Emergency Department: Admit: 2014-08-19 | Discharge status: Against Medical Advice | Payer: Self-pay | Admitting: Student

## 2014-08-31 NOTE — H&P (Signed)
L&D Evaluation:  History Expanded:   HPI 24 yo [id number] with EDD of 11/21/11 per LMP, presents today at 22 weeks with c/o of abdominal pain. Pt reports feeling constipated for the past few days, this am she drank a "dietary supplement beverage called 'Explosion' " and proceeded to have a bowel movement shortly after. She noted upper and lower abdominal after this, no VB of LOF. FM started about 2 weeks ago, but was decreased today. [facility], [facility] which time labs were drawn (records requested). She reports no PNC d/t waiting on Medicaid. She is considering moving to [place]    Patient's Medical History No Chronic Illness  SVD x 2 2009 & 2011 at term, no complications, SAB 06/2010 at 8 weeks    Patient's Surgical History D&C    Medications Pre Natal Vitamins    Allergies NKDA    Social History none    Family History Non-Contributory   ROS:   ROS see HPI   Exam:   Vital Signs stable    General no apparent distress    Mental Status clear    Chest clear    Heart no murmur/gallop/rubs    Abdomen gravid, non-tender, Fundus at U+2    Edema no edema    FHT Description 155 - appropriate for gestational age    Ucx absent   Impression:   Impression IUP with unconfirmed dates, abdominal pain, no PNC   Plan:   Plan UA    Comments US for dates/placenta location Will obtain PN labs needed once records reviewed Encourage [facility]   Electronic Signatures: Vella KohlerBrothers, Jaimya Feliciano K (CNM)  (Signed 27-Mar-13 16:56)  Authored: L&D Evaluation   Last Updated: 27-Mar-13 16:56 by Vella KohlerBrothers, Alfonza Toft K (CNM)

## 2014-11-09 ENCOUNTER — Encounter (HOSPITAL_COMMUNITY): Payer: Self-pay | Admitting: *Deleted

## 2014-11-09 ENCOUNTER — Emergency Department (HOSPITAL_COMMUNITY)
Admission: EM | Admit: 2014-11-09 | Discharge: 2014-11-09 | Disposition: A | Discharge status: Home / Self Care | Payer: Medicaid Other | Attending: Emergency Medicine | Admitting: Emergency Medicine

## 2014-11-09 DIAGNOSIS — Z720 Tobacco use: Secondary | ICD-10-CM | POA: Insufficient documentation

## 2014-11-09 DIAGNOSIS — Z3202 Encounter for pregnancy test, result negative: Secondary | ICD-10-CM | POA: Insufficient documentation

## 2014-11-09 DIAGNOSIS — J029 Acute pharyngitis, unspecified: Secondary | ICD-10-CM

## 2014-11-09 DIAGNOSIS — H9209 Otalgia, unspecified ear: Secondary | ICD-10-CM | POA: Insufficient documentation

## 2014-11-09 DIAGNOSIS — N898 Other specified noninflammatory disorders of vagina: Secondary | ICD-10-CM

## 2014-11-09 LAB — URINALYSIS, ROUTINE W REFLEX MICROSCOPIC
Bilirubin Urine: NEGATIVE
Glucose, UA: NEGATIVE mg/dL
Hgb urine dipstick: NEGATIVE
Ketones, ur: NEGATIVE mg/dL
Leukocytes, UA: NEGATIVE
Nitrite: NEGATIVE
PROTEIN: NEGATIVE mg/dL
Specific Gravity, Urine: 1.028 (ref 1.005–1.030)
Urobilinogen, UA: 1 mg/dL (ref 0.0–1.0)
pH: 5.5 (ref 5.0–8.0)

## 2014-11-09 LAB — WET PREP, GENITAL
Trich, Wet Prep: NONE SEEN
Yeast Wet Prep HPF POC: NONE SEEN

## 2014-11-09 LAB — PREGNANCY, URINE: Preg Test, Ur: NEGATIVE

## 2014-11-09 MED ORDER — METRONIDAZOLE 500 MG PO TABS: 500.0000 mg | ORAL_TABLET | Freq: Two times a day (BID) | ORAL | Status: DC

## 2014-11-09 NOTE — Discharge Instructions (Signed)
You were evaluated in the ED today for your sore throat and vaginal irritation. There is not appear to be an emergent cause her symptoms at this time. Please take your antibiotic as prescribed for your vaginal irritation. Please follow-up primary care for further evaluation and management of your symptoms. Return to ED for worsening symptoms.

## 2014-11-09 NOTE — ED Notes (Signed)
Called for triage no answer x2 
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
## 2014-11-09 NOTE — ED Notes (Signed)
Tech re attempted to call for patient with no response.Marland Kitchen..Marland Kitchen

## 2014-11-09 NOTE — ED Notes (Signed)
Patient presents with c/o sore throat for about 5 days and also c/o vaginal irritation denies discharge

## 2014-11-10 LAB — GC/CHLAMYDIA PROBE AMP (~~LOC~~) NOT AT ARMC
CHLAMYDIA, DNA PROBE: NEGATIVE
Neisseria Gonorrhea: NEGATIVE

## 2014-11-10 LAB — HIV ANTIBODY (ROUTINE TESTING W REFLEX): HIV Screen 4th Generation wRfx: NONREACTIVE

## 2014-11-24 ENCOUNTER — Emergency Department (HOSPITAL_COMMUNITY)
Admission: EM | Admit: 2014-11-24 | Discharge: 2014-11-24 | Disposition: A | Discharge status: Home / Self Care | Payer: Medicaid Other | Attending: Emergency Medicine | Admitting: Emergency Medicine

## 2014-11-24 ENCOUNTER — Encounter (HOSPITAL_COMMUNITY): Payer: Self-pay

## 2014-11-24 DIAGNOSIS — S80212A Abrasion, left knee, initial encounter: Secondary | ICD-10-CM | POA: Insufficient documentation

## 2014-11-24 DIAGNOSIS — Y9389 Activity, other specified: Secondary | ICD-10-CM | POA: Insufficient documentation

## 2014-11-24 DIAGNOSIS — S0081XA Abrasion of other part of head, initial encounter: Secondary | ICD-10-CM | POA: Insufficient documentation

## 2014-11-24 DIAGNOSIS — S20319A Abrasion of unspecified front wall of thorax, initial encounter: Secondary | ICD-10-CM | POA: Insufficient documentation

## 2014-11-24 DIAGNOSIS — S80211A Abrasion, right knee, initial encounter: Secondary | ICD-10-CM | POA: Insufficient documentation

## 2014-11-24 DIAGNOSIS — S60512A Abrasion of left hand, initial encounter: Secondary | ICD-10-CM | POA: Insufficient documentation

## 2014-11-24 DIAGNOSIS — Y998 Other external cause status: Secondary | ICD-10-CM | POA: Insufficient documentation

## 2014-11-24 DIAGNOSIS — S50811A Abrasion of right forearm, initial encounter: Secondary | ICD-10-CM | POA: Insufficient documentation

## 2014-11-24 DIAGNOSIS — Z720 Tobacco use: Secondary | ICD-10-CM | POA: Insufficient documentation

## 2014-11-24 DIAGNOSIS — Z23 Encounter for immunization: Secondary | ICD-10-CM | POA: Insufficient documentation

## 2014-11-24 DIAGNOSIS — Z79899 Other long term (current) drug therapy: Secondary | ICD-10-CM | POA: Insufficient documentation

## 2014-11-24 DIAGNOSIS — Y92410 Unspecified street and highway as the place of occurrence of the external cause: Secondary | ICD-10-CM | POA: Insufficient documentation

## 2014-11-24 MED ORDER — TETANUS-DIPHTH-ACELL PERTUSSIS 5-2.5-18.5 LF-MCG/0.5 IM SUSP
0.5000 mL | Freq: Once | INTRAMUSCULAR | Status: AC
  Administered 2014-11-24: 0.5 mL via INTRAMUSCULAR
  Filled 2014-11-24: qty 0.5

## 2014-11-24 MED ORDER — CEFTRIAXONE SODIUM 250 MG IJ SOLR
250.0000 mg | Freq: Once | INTRAMUSCULAR | Status: AC
  Administered 2014-11-24: 250 mg via INTRAMUSCULAR
  Filled 2014-11-24: qty 250

## 2014-11-24 MED ORDER — KETOROLAC TROMETHAMINE 30 MG/ML IJ SOLN
30.0000 mg | Freq: Once | INTRAMUSCULAR | Status: AC
  Administered 2014-11-24: 30 mg via INTRAMUSCULAR
  Filled 2014-11-24: qty 1

## 2014-11-24 MED ORDER — TRAMADOL HCL 50 MG PO TABS: 50.0000 mg | ORAL_TABLET | Freq: Two times a day (BID) | ORAL | Status: DC | PRN

## 2014-11-24 MED ORDER — TRAMADOL HCL 50 MG PO TABS
50.0000 mg | ORAL_TABLET | Freq: Once | ORAL | Status: AC
  Administered 2014-11-24: 50 mg via ORAL
  Filled 2014-11-24: qty 1

## 2014-11-24 MED ORDER — LIDOCAINE HCL (PF) 1 % IJ SOLN
0.9000 mL | Freq: Once | INTRAMUSCULAR | Status: AC
  Administered 2014-11-24: 0.9 mL via INTRADERMAL
  Filled 2014-11-24: qty 5

## 2014-11-24 MED ORDER — AZITHROMYCIN 250 MG PO TABS
500.0000 mg | ORAL_TABLET | Freq: Once | ORAL | Status: AC
  Administered 2014-11-24: 500 mg via ORAL
  Filled 2014-11-24: qty 2
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
## 2017-02-24 DIAGNOSIS — I609 Nontraumatic subarachnoid hemorrhage, unspecified: Secondary | ICD-10-CM | POA: Insufficient documentation

## 2017-02-24 DIAGNOSIS — S022XXA Fracture of nasal bones, initial encounter for closed fracture: Secondary | ICD-10-CM | POA: Insufficient documentation

## 2018-10-22 ENCOUNTER — Other Ambulatory Visit: Payer: Self-pay

## 2018-10-22 ENCOUNTER — Encounter: Payer: Self-pay | Admitting: Advanced Practice Midwife

## 2018-10-22 ENCOUNTER — Ambulatory Visit (INDEPENDENT_AMBULATORY_CARE_PROVIDER_SITE_OTHER): Payer: Medicaid Other | Admitting: Advanced Practice Midwife

## 2018-10-22 DIAGNOSIS — Z30432 Encounter for removal of intrauterine contraceptive device: Secondary | ICD-10-CM | POA: Insufficient documentation

## 2018-10-22 DIAGNOSIS — R030 Elevated blood-pressure reading, without diagnosis of hypertension: Secondary | ICD-10-CM | POA: Insufficient documentation

## 2018-10-22 NOTE — Patient Instructions (Signed)

## 2018-10-22 NOTE — Progress Notes (Signed)
    GYNECOLOGY OFFICE PROCEDURE NOTE  [name] [name] is a 28 y.o. [phone] here for [place] IUD removal and reinsertion. No GYN concerns.  Last pap smear was on 07/2011 and was normal.  IUD Removal  Patient identified, informed consent performed, consent signed. Time out was performed. Speculum placed in the vagina. The strings of the IUD were grasped and pulled using ring forceps. The IUD was successfully removed in its entirety. Patient tolerated procedure well.   Patient was given post-procedure instructions.  Patient noted to have elevated blood pressure x 2 in setting of history of Gestational Hypertension in 2013.  [facility]. She was encouraged to follow up on elevated blood pressure with that office.  Total visit time: 30 minutes. Greater than 50% of visit spent in counseling and coordination of care.  Mallie Snooks, CNM Certified Nurse [place] for [employer], [place]

## 2019-03-18 ENCOUNTER — Ambulatory Visit
Admission: EM | Admit: 2019-03-18 | Discharge: 2019-03-18 | Disposition: A | Discharge status: Home / Self Care | Payer: Medicaid Other | Attending: Emergency Medicine | Admitting: Emergency Medicine

## 2019-03-18 ENCOUNTER — Other Ambulatory Visit: Payer: Self-pay

## 2019-03-18 ENCOUNTER — Encounter: Payer: Self-pay | Admitting: Emergency Medicine

## 2019-03-18 DIAGNOSIS — M545 Low back pain, unspecified: Secondary | ICD-10-CM

## 2019-03-18 DIAGNOSIS — T22132A Burn of first degree of left upper arm, initial encounter: Secondary | ICD-10-CM

## 2019-03-18 MED ORDER — NAPROXEN 500 MG PO TABS: 500.0000 mg | ORAL_TABLET | Freq: Two times a day (BID) | ORAL | 0 refills | Status: DC

## 2019-03-18 MED ORDER — SILVER SULFADIAZINE 1 % EX CREA
TOPICAL_CREAM | Freq: Once | CUTANEOUS | Status: AC
  Administered 2019-03-18: 18:00:00 via TOPICAL

## 2019-03-18 NOTE — Discharge Instructions (Signed)
Back exercises provided. Take Naprosyn as directed. Important to apply Silvadene twice daily for the next 7 days, change dressings as shown on appointment. Return for worsening pain, change in bowel/bladder habit, fever.
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
## 2019-03-18 NOTE — ED Notes (Signed)
Patient able to ambulate independently  

## 2019-04-20 ENCOUNTER — Other Ambulatory Visit: Payer: Self-pay

## 2019-04-20 ENCOUNTER — Encounter: Payer: Self-pay | Admitting: Emergency Medicine

## 2019-04-20 ENCOUNTER — Ambulatory Visit
Admission: EM | Admit: 2019-04-20 | Discharge: 2019-04-20 | Disposition: A | Discharge status: Home / Self Care | Payer: Medicaid Other | Attending: Emergency Medicine | Admitting: Emergency Medicine

## 2019-04-20 DIAGNOSIS — M542 Cervicalgia: Secondary | ICD-10-CM | POA: Insufficient documentation

## 2019-04-20 DIAGNOSIS — Z3202 Encounter for pregnancy test, result negative: Secondary | ICD-10-CM | POA: Diagnosis not present

## 2019-04-20 DIAGNOSIS — B9689 Other specified bacterial agents as the cause of diseases classified elsewhere: Secondary | ICD-10-CM

## 2019-04-20 DIAGNOSIS — N760 Acute vaginitis: Secondary | ICD-10-CM | POA: Insufficient documentation

## 2019-04-20 LAB — POCT URINE PREGNANCY: Preg Test, Ur: NEGATIVE

## 2019-04-20 MED ORDER — CYCLOBENZAPRINE HCL 5 MG PO TABS
5.0000 mg | ORAL_TABLET | Freq: Two times a day (BID) | ORAL | 0 refills | Status: AC | PRN
Start: 2019-04-20 — End: 2019-04-25

## 2019-04-20 MED ORDER — FLUCONAZOLE 200 MG PO TABS: 200.0000 mg | ORAL_TABLET | Freq: Once | ORAL | 0 refills | Status: AC

## 2019-04-20 NOTE — ED Notes (Signed)
Patient able to ambulate independently  
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
## 2019-04-20 NOTE — ED Provider Notes (Signed)
[facility]    CSN: [id number] Arrival date & time: 04/20/19  1237      History   Chief Complaint Chief Complaint  Patient presents with  . Neck Pain    HPI [name] [name] is a 28 y.o. female presenting for recent neck pain for the last 4 days.  Patient states that she broke her neck in 2014: Chart review done by me at time appointment does not reflect this, possibly done externally.  Patient denies trauma to the area, think she may have slept weird night prior to symptom onset.  Patient states that pain is right-sided, will extend into her right shoulder, worse with certain movements.  Patient works in [employer], does have to perform heavy lifting, overhead reaching.  Denies inciting event/trauma to the area prior to pain this episode.  No pop/snap/tearing sensation in neck, upper extremity numbness, weakness. Patient also noting 3-day course of vaginal irritation with thick, cottage cheeselike discharge.  Patient endorsing vaginal pruritus as well.  Patient does have history of vaginal yeast infection, BV.  Has not tried thing for symptoms.  States this feels most like yeast.  Currently sexually active, not using condoms.  LMP 12/1.   Past Medical History:  Diagnosis Date  . 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 Group B Streptococcus carrier, +RV culture, currently pregnant 10/29/2011   Needs intrapartum prophylaxis   . Late prenatal care 08/08/2011   Started PNC at 25 weeks   . NSVD (normal spontaneous vaginal delivery) 11/25/2011  . Pregnancy induced hypertension 2009  . Supervision of normal pregnancy 08/08/2011   Too late for genetic testing. 1 hour glucola 78.  Normal anatomy scan. Plans to bottle feed. She wants Nexplanon for birth control    . Tobacco smoking complicating pregnancy 08/08/2011   Reports smoking 3 cigarettes/week.  Encouraged to quit, resources reviewed.     Patient Active Problem List   Diagnosis Date Noted  . Encounter for IUD removal 10/22/2018  .  Elevated blood pressure reading 10/22/2018    Past Surgical History:  Procedure Laterality Date  . DILATION AND CURETTAGE OF UTERUS      OB History    Gravida  4   Para  3   Term  3   Preterm      AB  1   Living  3     SAB  1   TAB      Ectopic      Multiple      Live Births  3            Home Medications    Prior to Admission medications   Medication Sig Start Date End Date Taking? Authorizing Provider  ibuprofen (ADVIL,MOTRIN) 400 MG tablet Take 400 mg by mouth every 6 (six) hours as needed.    [provider]  naproxen (NAPROSYN) 500 MG tablet Take 1 tablet (500 mg total) by mouth 2 (two) times daily. 03/18/19   Hall-Potvin, [place], PA-C    Family History Family History  Problem Relation Age of Onset  . Anesthesia problems Neg Hx   . Hypotension Neg Hx   . Malignant hyperthermia Neg Hx   . Pseudochol deficiency Neg Hx     Social History Social History   Tobacco Use  . Smoking status: Current Some Day Smoker    Packs/day: 0.25    Last attempt to quit: 09/21/2011    Years since quitting: 7.5  . Smokeless tobacco: Never Used  Substance Use Topics  .  Alcohol use: No  . Drug use: No     Allergies   Patient has no known allergies.   Review of Systems Review of Systems  Constitutional: Negative for fatigue and fever.  Respiratory: Negative for cough and shortness of breath.   Cardiovascular: Negative for chest pain and palpitations.  Genitourinary: Positive for vaginal discharge. Negative for dysuria, genital sores, pelvic pain and vaginal bleeding.  Musculoskeletal: Positive for neck pain. Negative for back pain, gait problem and neck stiffness.  Neurological: Negative for weakness and numbness.     Physical Exam Triage Vital Signs ED Triage Vitals [04/20/19 1417]  Enc Vitals Group     BP 135/90     Pulse Rate 70     Resp 16     Temp 98.5 F (36.9 C)     Temp Source Oral     SpO2 98 %     Weight      Height       Head Circumference      Peak Flow      Pain Score 8     Pain Loc      Pain Edu?      Excl. in GC?    No data found.  Updated Vital Signs BP 135/90 (BP Location: Right Arm)   Pulse 70   Temp 98.5 F (36.9 C) (Oral)   Resp 16   LMP 03/24/2019   SpO2 98%   Visual Acuity Right Eye Distance:   Left Eye Distance:   Bilateral Distance:    Right Eye Near:   Left Eye Near:    Bilateral Near:     Physical Exam Constitutional:      General: She is not in acute distress. HENT:     Head: Normocephalic and atraumatic.  Eyes:     General: No scleral icterus.    Pupils: Pupils are equal, round, and reactive to light.  Neck:     Vascular: No carotid bruit.     Comments: End of right-sided neck.  Hypertonic superior trapezius without spasm, deformity, overlying rash. Cardiovascular:     Rate and Rhythm: Normal rate.  Pulmonary:     Effort: Pulmonary effort is normal.  Abdominal:     General: Bowel sounds are normal.     Palpations: Abdomen is soft.     Tenderness: There is no abdominal tenderness. There is no right CVA tenderness, left CVA tenderness or guarding.  Genitourinary:    Comments: Patient declined, self-swab performed Musculoskeletal:        General: No swelling. Normal range of motion.     Cervical back: Normal range of motion and neck supple. Tenderness present. No rigidity.     Comments: Full active ROM of shoulders bilaterally without bony tenderness.  Neurovascularly intact in distal upper extremities.  Lymphadenopathy:     Cervical: No cervical adenopathy.  Skin:    Coloration: Skin is not jaundiced or pale.  Neurological:     Mental Status: She is alert and oriented to person, place, and time.      UC Treatments / Results  Labs (all labs ordered are listed, but only abnormal results are displayed) Labs Reviewed  POCT URINE PREGNANCY  CERVICOVAGINAL ANCILLARY ONLY    EKG   Radiology No results found.  Procedures Procedures (including  critical care time)  Medications Ordered in UC Medications - No data to display  Initial Impression / Assessment and Plan / UC Course  I have reviewed the triage vital signs and  the nursing notes.  Pertinent labs & imaging results that were available during my care of the patient were reviewed by me and considered in my medical decision making (see chart for details).     Patient afebrile, nontoxic in office today.  No inciting event for neck pain: We will add muscle relaxer as musculoskeletal strain likely.  Work note provided for light duty x5 days. POC urine pregnancy done in office, reviewed by me: Negative.  Diflucan sent for vaginitis.  Swab pending: We will treat if indicated.  Return precautions discussed, patient verbalized understanding and is agreeable to plan. Final Clinical Impressions(s) / UC Diagnoses   Final diagnoses:  Acute vaginitis  Neck pain on right side     Discharge Instructions     Important to do neck exercises every day. May continue naproxen 1 tab twice daily for the next week May take muscle relaxer as needed for severe pain / spasm.  (This medication may cause you to become tired so it is important you do not drink alcohol or operate heavy machinery while on this medication.  Recommend your first dose to be taken before bedtime to monitor for side effects safely)  Take Diflucan once, may repeat in 3 days if needed. STD/BV swab pending: We will call you if positive and needing treatment. Return for worsening discharge, vaginal pelvic pain, fever.    ED Prescriptions    None     PDMP not reviewed this encounter.   Hall-Potvin, [place], [place] 04/20/19 1458

## 2019-04-20 NOTE — Discharge Instructions (Addendum)
Important to do neck exercises every day. May continue naproxen 1 tab twice daily for the next week May take muscle relaxer as needed for severe pain / spasm.  (This medication may cause you to become tired so it is important you do not drink alcohol or operate heavy machinery while on this medication.  Recommend your first dose to be taken before bedtime to monitor for side effects safely)  Take Diflucan once, may repeat in 3 days if needed. STD/BV swab pending: We will call you if positive and needing treatment. Return for worsening discharge, vaginal pelvic pain, fever.

## 2019-04-20 NOTE — ED Notes (Signed)
Attempted to call patient with number in chart, no answer.  Attempted to walk out to parking lot to see if I could find anyone sitting in their car, could not find patient.  Told registration to let me know if she comes back in side.

## 2019-04-22 ENCOUNTER — Telehealth: Payer: Self-pay | Admitting: Emergency Medicine

## 2019-04-22 LAB — CERVICOVAGINAL ANCILLARY ONLY
Bacterial vaginitis: NEGATIVE
Candida vaginitis: POSITIVE — AB
Chlamydia: NEGATIVE
Neisseria Gonorrhea: POSITIVE — AB
Trichomonas: NEGATIVE

## 2019-04-22 NOTE — Telephone Encounter (Signed)
Gonorrhea is positive. Due to changes in CDC recommendations, pt needs treatment with only 500mg  IM rocephin.    Candida (yeast) is positive.  Prescription for fluconazole was given at the urgent care visit.    Patient contacted by phone and made aware of    results. Pt verbalized understanding and had all questions answered.

## 2019-04-27 ENCOUNTER — Other Ambulatory Visit: Payer: Self-pay

## 2019-04-27 ENCOUNTER — Encounter (HOSPITAL_BASED_OUTPATIENT_CLINIC_OR_DEPARTMENT_OTHER): Payer: Self-pay | Admitting: *Deleted

## 2019-04-27 ENCOUNTER — Emergency Department (HOSPITAL_BASED_OUTPATIENT_CLINIC_OR_DEPARTMENT_OTHER)
Admission: EM | Admit: 2019-04-27 | Discharge: 2019-04-27 | Disposition: A | Discharge status: Home / Self Care | Payer: Medicaid Other | Attending: Emergency Medicine | Admitting: Emergency Medicine

## 2019-04-27 DIAGNOSIS — Z5321 Procedure and treatment not carried out due to patient leaving prior to being seen by health care provider: Secondary | ICD-10-CM | POA: Insufficient documentation

## 2019-04-27 DIAGNOSIS — N898 Other specified noninflammatory disorders of vagina: Secondary | ICD-10-CM | POA: Diagnosis present

## 2019-04-27 LAB — WET PREP, GENITAL
Sperm: NONE SEEN
Trich, Wet Prep: NONE SEEN
Yeast Wet Prep HPF POC: NONE SEEN

## 2019-04-27 MED ORDER — AZITHROMYCIN 1 G PO PACK
1.0000 g | PACK | Freq: Once | ORAL | Status: AC
  Administered 2019-04-27: 1 g via ORAL
  Filled 2019-04-27: qty 1

## 2019-04-27 MED ORDER — CEFTRIAXONE SODIUM 250 MG IJ SOLR
250.0000 mg | Freq: Once | INTRAMUSCULAR | Status: AC
  Administered 2019-04-27: 20:00:00 250 mg via INTRAMUSCULAR
  Filled 2019-04-27: qty 250

## 2019-04-27 NOTE — ED Triage Notes (Addendum)
Pt c/o vaginal discharge x 3 days pt denies abd pain , requesting no pelvic

## 2019-04-29 LAB — GC/CHLAMYDIA PROBE AMP (~~LOC~~) NOT AT ARMC
Chlamydia: NEGATIVE
Neisseria Gonorrhea: POSITIVE — AB

## 2019-08-26 ENCOUNTER — Ambulatory Visit
Admission: EM | Admit: 2019-08-26 | Discharge: 2019-08-26 | Disposition: A | Discharge status: Home / Self Care | Payer: Medicaid Other | Attending: Physician Assistant | Admitting: Physician Assistant

## 2019-08-26 DIAGNOSIS — N898 Other specified noninflammatory disorders of vagina: Secondary | ICD-10-CM

## 2019-08-26 DIAGNOSIS — Z3201 Encounter for pregnancy test, result positive: Secondary | ICD-10-CM

## 2019-08-26 LAB — POCT URINE PREGNANCY: Preg Test, Ur: POSITIVE — AB

## 2019-08-26 MED ORDER — MICONAZOLE 3 4 % VA CREA: 1.0000 | TOPICAL_CREAM | Freq: Every day | VAGINAL | 0 refills | Status: AC

## 2019-08-26 MED ORDER — PRENATAL COMPLETE 14-0.4 MG PO TABS: 1.0000 | ORAL_TABLET | Freq: Every day | ORAL | 0 refills | Status: DC

## 2019-08-26 NOTE — ED Triage Notes (Signed)
Pt c/o vaginal irritation x2 days, denies discharge. States has had 3 positive home pregnancy test. States went to [facility] yesterday and had a neg preg test.

## 2019-08-26 NOTE — Discharge Instructions (Addendum)
Urine pregnancy positive. Miconazole as directed for yeast. Start prenatal vitamins. Avoid ibuprofen/naproxen, alcohol. Follow up with OB for further evaluation and management needed. If having abdominal pain, vaginal bleeding, go to the ED for further evaluation needed.

## 2019-08-26 NOTE — ED Provider Notes (Signed)
[facility]    CSN: [id number] Arrival date & time: 08/26/19  1132      History   Chief Complaint Chief Complaint  Patient presents with  . Vaginal Itching    HPI [name] [name] is a 29 y.o. female.   29 year old female comes in for 2-day history of vaginal irritation.  She is also coming for pregnancy confirmation.  States LMP 07/25/2019, and has had 3+ pregnancy tests at home.  She has had vaginal irritation without obvious discharge, spotting.  Denies urinary symptoms such as frequency, dysuria, hematuria.  States has had intermittent left lower quadrant pain that is not severe.  Denies nausea, vomiting, diarrhea. Patient has had changes in hygiene products, and have been doing baths.      Past Medical History:  Diagnosis Date  . 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 Group B Streptococcus carrier, +RV culture, currently pregnant 10/29/2011   Needs intrapartum prophylaxis   . Late prenatal care 08/08/2011   Started [place] at 25 weeks   . NSVD (normal spontaneous vaginal delivery) 11/25/2011  . Pregnancy induced hypertension 2009  . Supervision of normal pregnancy 08/08/2011   Too late for genetic testing. 1 hour glucola 78.  Normal anatomy scan. Plans to bottle feed. She wants Nexplanon for birth control    . Tobacco smoking complicating pregnancy 12/03/7515   Reports smoking 3 cigarettes/week.  Encouraged to quit, resources reviewed.     Patient Active Problem List   Diagnosis Date Noted  . Encounter for IUD removal 10/22/2018  . Elevated blood pressure reading 10/22/2018    Past Surgical History:  Procedure Laterality Date  . DILATION AND CURETTAGE OF UTERUS      OB History    Gravida  4   Para  3   Term  3   Preterm      AB  1   Living  3     SAB  1   TAB      Ectopic      Multiple      Live Births  3            Home Medications    Prior to Admission medications   Medication Sig Start Date End Date Taking? Authorizing Provider  MICONAZOLE  NITRATE VAGINAL (MICONAZOLE 3) 4 % CREA Place 1 Dose vaginally at bedtime for 3 days. 08/26/19 08/29/19  Ok Edwards, PA-C  Prenatal Vit-Fe Fumarate-FA (PRENATAL COMPLETE) 14-0.4 MG TABS Take 1 Dose by mouth daily. 08/26/19   Ok Edwards, PA-C    Family History Family History  Problem Relation Age of Onset  . Anesthesia problems Neg Hx   . Hypotension Neg Hx   . Malignant hyperthermia Neg Hx   . Pseudochol deficiency Neg Hx     Social History Social History   Tobacco Use  . Smoking status: Current Some Day Smoker    Packs/day: 0.25    Last attempt to quit: 09/21/2011    Years since quitting: 7.9  . Smokeless tobacco: Never Used  Substance Use Topics  . Alcohol use: No  . Drug use: No     Allergies   Patient has no known allergies.   Review of Systems Review of Systems  Reason unable to perform ROS: See HPI as above.     Physical Exam Triage Vital Signs ED Triage Vitals  Enc Vitals Group     BP 08/26/19 1145 135/84     Pulse Rate 08/26/19 1145 76  Resp 08/26/19 1145 20     Temp 08/26/19 1145 98.3 F (36.8 C)     Temp Source 08/26/19 1145 Oral     SpO2 08/26/19 1145 98 %     Weight --      Height --      Head Circumference --      Peak Flow --      Pain Score 08/26/19 1154 0     Pain Loc --      Pain Edu? --      Excl. in GC? --    No data found.  Updated Vital Signs BP 135/84 (BP Location: Right Arm)   Pulse 76   Temp 98.3 F (36.8 C) (Oral)   Resp 20   LMP 07/25/2019   SpO2 98%   Physical Exam Constitutional:      General: She is not in acute distress.    Appearance: She is well-developed. She is not ill-appearing, toxic-appearing or diaphoretic.  HENT:     Head: Normocephalic and atraumatic.  Eyes:     Conjunctiva/sclera: Conjunctivae normal.     Pupils: Pupils are equal, round, and reactive to light.  Pulmonary:     Effort: Pulmonary effort is normal. No respiratory distress.  Abdominal:     General: Bowel sounds are normal.     Palpations:  Abdomen is soft.     Tenderness: There is no abdominal tenderness. There is no right CVA tenderness, left CVA tenderness, guarding or rebound.  Musculoskeletal:     Cervical back: Normal range of motion and neck supple.  Skin:    General: Skin is warm and dry.  Neurological:     Mental Status: She is alert and oriented to person, place, and time.  Psychiatric:        Behavior: Behavior normal.        Judgment: Judgment normal.    UC Treatments / Results  Labs (all labs ordered are listed, but only abnormal results are displayed) Labs Reviewed  POCT URINE PREGNANCY - Abnormal; Notable for the following components:      Result Value   Preg Test, Ur Positive (*)    All other components within normal limits    EKG   Radiology No results found.  Procedures Procedures (including critical care time)  Medications Ordered in UC Medications - No data to display  Initial Impression / Assessment and Plan / UC Course  I have reviewed the triage vital signs and the nursing notes.  Pertinent labs & imaging results that were available during my care of the patient were reviewed by me and considered in my medical decision making (see chart for details).    Urine pregnancy positive. Will provide miconazole vaginal inserts for yeast. Start prenatal vitamins. Return precautions given. Otherwise follow up with OB for further evaluation and management needed. Patient expresses understanding and agrees to plan.   Final Clinical Impressions(s) / UC Diagnoses   Final diagnoses:  Pregnancy confirmed by positive urine test  Vaginal irritation    ED Prescriptions    Medication Sig Dispense Auth. Provider   MICONAZOLE NITRATE VAGINAL (MICONAZOLE 3) 4 % CREA Place 1 Dose vaginally at bedtime for 3 days. 5 g Lyndsy Gilberto V, PA-C   Prenatal Vit-Fe Fumarate-FA (PRENATAL COMPLETE) 14-0.4 MG TABS Take 1 Dose by mouth daily. 30 tablet Belinda Fisher, PA-C     PDMP not reviewed this encounter.   Belinda Fisher, PA-C 08/26/19 1246

## 2019-09-13 ENCOUNTER — Encounter: Payer: Self-pay | Admitting: Emergency Medicine

## 2019-09-13 ENCOUNTER — Ambulatory Visit
Admission: EM | Admit: 2019-09-13 | Discharge: 2019-09-13 | Disposition: A | Discharge status: Home / Self Care | Payer: Medicaid Other

## 2019-09-13 ENCOUNTER — Other Ambulatory Visit: Payer: Self-pay

## 2019-09-13 DIAGNOSIS — N939 Abnormal uterine and vaginal bleeding, unspecified: Secondary | ICD-10-CM | POA: Diagnosis not present

## 2019-09-13 DIAGNOSIS — Z3A01 Less than 8 weeks gestation of pregnancy: Secondary | ICD-10-CM

## 2019-09-13 NOTE — ED Triage Notes (Signed)
vaginal bleeding started approx 1 hour ago. Reports cramping after having sex last night and still cramping this morning.  [facility] has [facility].  Patient has an appt lined up for June 8.  Patient has had children and has not had spotting before .  Patient noticed blood on tissue after wiping.  Patient was constipated , but did have a bm today.  Patient does not think blood on tissue from rectum.  Patient has not been cramping this afternoon

## 2019-09-13 NOTE — Discharge Instructions (Signed)
Important to monitor closely for repeat bleeding. Avoid intercourse x2 days. May take Tylenol as needed for pain. Can also use heating pads. [facility], abdominal or back pain, weakness, profuse vaginal discharge.

## 2019-09-13 NOTE — ED Provider Notes (Signed)
[facility]    CSN: [id number] Arrival date & time: 09/13/19  1508      History   Chief Complaint Chief Complaint  Patient presents with  . Vaginal Bleeding    HPI [name] [name] is a 29 y.o. female [redacted] weeks gestation presenting for possible vaginal bleeding.  States this started about an hour ago.  Did have cramping last night after intercourse: Denies rough coitus or fall.  Denies trauma to abdomen or back.  No fever, back pain, belly pain currently.  States that she called [facility] after this happened and was instructed to take Tylenol which relieved her abdominal cramping.  Patient denying vaginal pelvic pain, vaginal discharge, change in bowel or bladder habit.  Patient denies history of hemorrhoids, rectal pain, perianal itching.  Has had bowel movement and urinated since without blood.   Past Medical History:  Diagnosis Date  . 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 Group B Streptococcus carrier, +RV culture, currently pregnant 10/29/2011   Needs intrapartum prophylaxis   . Late prenatal care 08/08/2011   Started [place] at 25 weeks   . NSVD (normal spontaneous vaginal delivery) 11/25/2011  . Pregnancy induced hypertension 2009  . Supervision of normal pregnancy 08/08/2011   Too late for genetic testing. 1 hour glucola 78.  Normal anatomy scan. Plans to bottle feed. She wants Nexplanon for birth control    . Tobacco smoking complicating pregnancy 8/33/8250   Reports smoking 3 cigarettes/week.  Encouraged to quit, resources reviewed.     Patient Active Problem List   Diagnosis Date Noted  . Encounter for IUD removal 10/22/2018  . Elevated blood pressure reading 10/22/2018    Past Surgical History:  Procedure Laterality Date  . DILATION AND CURETTAGE OF UTERUS      OB History    Gravida  4   Para  3   Term  3   Preterm      AB  1   Living  3     SAB  1   TAB      Ectopic      Multiple      Live Births  3            Home Medications     Prior to Admission medications   Medication Sig Start Date End Date Taking? Authorizing Provider  Prenatal Vit-Fe Fumarate-FA (PRENATAL COMPLETE) 14-0.4 MG TABS Take 1 Dose by mouth daily. 08/26/19   Ok Edwards, PA-C    Family History Family History  Problem Relation Age of Onset  . Anesthesia problems Neg Hx   . Hypotension Neg Hx   . Malignant hyperthermia Neg Hx   . Pseudochol deficiency Neg Hx     Social History Social History   Tobacco Use  . Smoking status: Current Some Day Smoker    Packs/day: 0.25    Last attempt to quit: 09/21/2011    Years since quitting: 7.9  . Smokeless tobacco: Never Used  Substance Use Topics  . Alcohol use: No  . Drug use: No     Allergies   Patient has no known allergies.   Review of Systems As per HPI   Physical Exam Triage Vital Signs ED Triage Vitals  Enc Vitals Group     BP 09/13/19 1536 121/76     Pulse Rate 09/13/19 1536 67     Resp 09/13/19 1536 19     Temp 09/13/19 1536 98.9 F (37.2 C)     Temp Source 09/13/19  1536 Oral     SpO2 09/13/19 1536 98 %     Weight --      Height --      Head Circumference --      Peak Flow --      Pain Score 09/13/19 1534 0     Pain Loc --      Pain Edu? --      Excl. in GC? --    No data found.  Updated Vital Signs BP 121/76 (BP Location: Left Arm)   Pulse 67   Temp 98.9 F (37.2 C) (Oral)   Resp 19   LMP 07/25/2019   SpO2 98%   Visual Acuity Right Eye Distance:   Left Eye Distance:   Bilateral Distance:    Right Eye Near:   Left Eye Near:    Bilateral Near:     Physical Exam   UC Treatments / Results  Labs (all labs ordered are listed, but only abnormal results are displayed) Labs Reviewed - No data to display  EKG   Radiology No results found.  Procedures Procedures (including critical care time)  Medications Ordered in UC Medications - No data to display  Initial Impression / Assessment and Plan / UC Course  I have reviewed the triage vital signs  and the nursing notes.  Pertinent labs & imaging results that were available during my care of the patient were reviewed by me and considered in my medical decision making (see chart for details).     Afebrile, nontoxic, and hemodynamically stable in office.  Patient without significant abdominal pelvic pain, back pain, fever.  No vaginal bleeding: Discussed this could have been from vaginal trauma second to colitis, possible spotting, hemorrhoids.  Patient declined exam.  Will monitor closely, go to [facility] for worsening vaginal bleeding, pelvic or vaginal pain, abdominal pain, back pain.  Return precautions discussed, patient verbalized understanding and is agreeable to plan. Final Clinical Impressions(s) / UC Diagnoses   Final diagnoses:  [redacted] weeks gestation of pregnancy  Vaginal bleeding     Discharge Instructions     Important to monitor closely for repeat bleeding. Avoid intercourse x2 days. May take Tylenol as needed for pain. Can also use heating pads. [facility] bleeding, abdominal or back pain, weakness, profuse vaginal discharge.    ED Prescriptions    None     PDMP not reviewed this encounter.   Hall-Potvin, [place], [place] 09/13/19 1601

## 2019-09-29 ENCOUNTER — Encounter: Payer: Medicaid Other | Admitting: Advanced Practice Midwife

## 2019-10-13 ENCOUNTER — Encounter: Payer: Self-pay | Admitting: Advanced Practice Midwife

## 2019-10-13 ENCOUNTER — Ambulatory Visit (INDEPENDENT_AMBULATORY_CARE_PROVIDER_SITE_OTHER): Payer: Medicaid Other | Admitting: Advanced Practice Midwife

## 2019-10-13 ENCOUNTER — Other Ambulatory Visit (HOSPITAL_COMMUNITY)
Admission: RE | Admit: 2019-10-13 | Discharge: 2019-10-13 | Disposition: A | Discharge status: Home / Self Care | Payer: Medicaid Other | Source: Ambulatory Visit | Attending: Advanced Practice Midwife | Admitting: Advanced Practice Midwife

## 2019-10-13 ENCOUNTER — Other Ambulatory Visit: Payer: Self-pay

## 2019-10-13 VITALS — BP 123/70 | HR 84 | Wt 145.0 lb

## 2019-10-13 DIAGNOSIS — O3680X0 Pregnancy with inconclusive fetal viability, not applicable or unspecified: Secondary | ICD-10-CM | POA: Diagnosis not present

## 2019-10-13 DIAGNOSIS — O99331 Smoking (tobacco) complicating pregnancy, first trimester: Secondary | ICD-10-CM

## 2019-10-13 DIAGNOSIS — F17200 Nicotine dependence, unspecified, uncomplicated: Secondary | ICD-10-CM | POA: Insufficient documentation

## 2019-10-13 DIAGNOSIS — Z3480 Encounter for supervision of other normal pregnancy, unspecified trimester: Secondary | ICD-10-CM | POA: Insufficient documentation

## 2019-10-13 DIAGNOSIS — Z3A11 11 weeks gestation of pregnancy: Secondary | ICD-10-CM | POA: Diagnosis not present

## 2019-10-13 DIAGNOSIS — Z8759 Personal history of other complications of pregnancy, childbirth and the puerperium: Secondary | ICD-10-CM | POA: Insufficient documentation

## 2019-10-13 DIAGNOSIS — I10 Essential (primary) hypertension: Secondary | ICD-10-CM

## 2019-10-13 DIAGNOSIS — O10011 Pre-existing essential hypertension complicating pregnancy, first trimester: Secondary | ICD-10-CM

## 2019-10-13 NOTE — Progress Notes (Signed)
DATING AND VIABILITY SONOGRAM   [name] [name] is a 29 y.o. year old [id number] with LMP Patient's last menstrual period was 07/25/2019 (exact date). which would correlate to  [redacted]w[redacted]d weeks gestation.  She has regular menstrual cycles.   She is here today for a confirmatory initial sonogram.    GESTATION: SINGLETON  FETAL ACTIVITY:          Heart rate       159 bpm          The fetus is active.     GESTATIONAL AGE AND  BIOMETRICS:  Gestational criteria: Estimated Date of Delivery: 04/30/20 by LMP now at [redacted]w[redacted]d  Previous Scans:0      CROWN RUMP LENGTH           4.29 cm        11-1 weeks                                                                               AVERAGE EGA(BY THIS SCAN):  11-1 weeks  WORKING EDD( LMP ):  04/30/2020     TECHNICIAN COMMENTS: Patient informed that the ultrasound is considered a limited obstetric ultrasound and is not intended to be a complete ultrasound exam. Patient also informed that the ultrasound is not being completed with the intent of assessing for fetal or placental anomalies or any pelvic abnormalities. Explained that the purpose of today's ultrasound is to assess for fetal heart rate. Patient acknowledges the purpose of the exam and the limitations of the study.     Armandina Stammer 10/13/2019 10:55 AM

## 2019-10-13 NOTE — Patient Instructions (Signed)
Genetic Testing During Pregnancy Genetic testing during pregnancy is also called prenatal genetic testing. This type of testing can determine if your baby is at risk of being born with a disorder caused by abnormal genes or chromosomes (genetic disorder). Chromosomes contain genes that control how your baby will develop in your womb. There are many different genetic disorders. Examples of genetic disorders that may be found through genetic testing include Down syndrome and cystic fibrosis. Gene changes (mutations) can be passed down through families. Genetic testing is offered to all women before or during pregnancy. You can choose whether to have genetic testing. Why is genetic testing done? Genetic testing is done during pregnancy to find out whether your child is at risk for a genetic disorder. Having genetic testing allows you to:  Discuss your test results and options with a genetic counselor.  Prepare for a baby that may be born with a genetic disorder. Learning about the disorder ahead of time helps you be better prepared to manage it. Your health care providers can also be prepared in case your baby requires special care before or after birth.  Consider whether you want to continue with the pregnancy. In some cases, genetic testing may be done to learn about the traits a child will inherit. Types of genetic tests There are two basic types of genetic testing. Screening tests indicate whether your developing baby (fetus) is at higher risk for a genetic disorder. Diagnostic tests check actual fetal cells to diagnose a genetic disorder. Screening tests     Screening tests will not harm your baby. They are recommended for all pregnant women. Types of screening tests include:  Carrier screening. This test involves checking genes from both parents by testing their blood or saliva. The test checks to find out if the parents carry a genetic mutation that may be passed to a baby. In most cases,  both parents must carry the mutation for a baby to be at risk.  First trimester screening. This test combines a blood test with sound wave imaging of your baby (fetal ultrasound). This screening test checks for a risk of Down syndrome or other defects caused by having extra chromosomes. It also checks for defects of the heart, abdomen, or skeleton.  Second trimester screening also combines a blood test with a fetal ultrasound exam. It checks for a risk of genetic defects of the face, brain, spine, heart, or limbs.  Combined or sequential screening. This type of testing combines the results of first and second trimester screening. This type of testing may be more accurate than first or second trimester screening alone.  Cell-free DNA testing. This is a blood test that detects cells released by the placenta that get into the mother's blood. It can be used to check for a risk of Down syndrome, other extra chromosome syndromes, and disorders caused by abnormal numbers of sex chromosomes. This test can be done any time after 10 weeks of pregnancy.  Diagnostic tests Diagnostic tests carry slight risks of problems, including bleeding, infection, and loss of the pregnancy. These tests are done only if your baby is at risk for a genetic disorder. You may meet with a genetic counselor to discuss the risks and benefits before having diagnostic tests. Examples of diagnostic tests include:  Chorionic villus sampling (CVS). This involves a procedure to remove and test a sample of cells taken from the placenta. The procedure may be done between 10 and 12 weeks of pregnancy.  Amniocentesis. This involves a   procedure to remove and test a sample of fluid (amniotic fluid) and cells from the sac that surrounds the developing baby. The procedure may be done between 15 and 20 weeks of pregnancy. What do the results mean? For a screening test:  If the results are negative, it often means that your child is not at higher  risk. There is still a slight chance your child could have a genetic disorder.  If the results are positive, it does not mean your child will have a genetic disorder. It may mean that your child has a higher-than-normal risk for a genetic disorder. In that case, you may want to talk with a genetic counselor about whether you should have diagnostic genetic tests. For a diagnostic test:  If the result is negative, it is unlikely that your child will have a genetic disorder.  If the test is positive for a genetic disorder, it is likely that your child will have the disorder. The test may not tell how severe the disorder will be. Talk with your health care provider about your options. Questions to ask your health care provider Before talking to your health care provider about genetic testing, find out if there is a history of genetic disorders in your family. It may also help to know your family's ethnic origins. Then ask your health care provider the following questions:  Is my baby at risk for a genetic disorder?  What are the benefits of having genetic screening?  What tests are best for me and my baby?  What are the risks of each test?  If I get a positive result on a screening test, what is the next step?  Should I meet with a genetic counselor before having a diagnostic test?  Should my partner or other members of my family be tested?  How much do the tests cost? Will my insurance cover the testing? Summary  Genetic testing is done during pregnancy to find out whether your child is at risk for a genetic disorder.  Genetic testing is offered to all women before or during pregnancy. You can choose whether to have genetic testing.  There are two basic types of genetic testing. Screening tests indicate whether your developing baby (fetus) is at higher risk for a genetic disorder. Diagnostic tests check actual fetal cells to diagnose a genetic disorder.  If a diagnostic genetic test is  positive, talk with your health care provider about your options. This information is not intended to replace advice given to you by your health care provider. Make sure you discuss any questions you have with your health care provider. Document Revised: 07/31/2018 Document Reviewed: 06/24/2017 Elsevier Patient Education  2020 [employer] First Trimester of Pregnancy The first trimester of pregnancy is from week 1 until the end of week 13 (months 1 through 3). A week after a sperm fertilizes an egg, the egg will implant on the wall of the uterus. This embryo will begin to develop into a baby. Genes from you and your partner will form the baby. The female genes will determine whether the baby will be a boy or a girl. At 6-8 weeks, the eyes and face will be formed, and the heartbeat can be seen on ultrasound. At the end of 12 weeks, all the baby's organs will be formed. Now that you are pregnant, you will want to do everything you can to have a healthy baby. Two of the most important things are to get good prenatal care and to   follow your health care provider's instructions. Prenatal care is all the medical care you receive before the baby's birth. This care will help prevent, find, and treat any problems during the pregnancy and childbirth. Body changes during your first trimester Your body goes through many changes during pregnancy. The changes vary from woman to woman.  You may gain or lose a couple of pounds at first.  You may feel sick to your stomach (nauseous) and you may throw up (vomit). If the vomiting is uncontrollable, call your health care provider.  You may tire easily.  You may develop headaches that can be relieved by medicines. All medicines should be approved by your health care provider.  You may urinate more often. Painful urination may mean you have a bladder infection.  You may develop heartburn as a result of your pregnancy.  You may develop constipation because certain  hormones are causing the muscles that push stool through your intestines to slow down.  You may develop hemorrhoids or swollen veins (varicose veins).  Your breasts may begin to grow larger and become tender. Your nipples may stick out more, and the tissue that surrounds them (areola) may become darker.  Your gums may bleed and may be sensitive to brushing and flossing.  Dark spots or blotches (chloasma, mask of pregnancy) may develop on your face. This will likely fade after the baby is born.  Your menstrual periods will stop.  You may have a loss of appetite.  You may develop cravings for certain kinds of food.  You may have changes in your emotions from day to day, such as being excited to be pregnant or being concerned that something may go wrong with the pregnancy and baby.  You may have more vivid and strange dreams.  You may have changes in your hair. These can include thickening of your hair, rapid growth, and changes in texture. Some women also have hair loss during or after pregnancy, or hair that feels dry or thin. Your hair will most likely return to normal after your baby is born. What to expect at prenatal visits During a routine prenatal visit:  You will be weighed to make sure you and the baby are growing normally.  Your blood pressure will be taken.  Your abdomen will be measured to track your baby's growth.  The fetal heartbeat will be listened to between weeks 10 and 14 of your pregnancy.  Test results from any previous visits will be discussed. Your health care provider may ask you:  How you are feeling.  If you are feeling the baby move.  If you have had any abnormal symptoms, such as leaking fluid, bleeding, severe headaches, or abdominal cramping.  If you are using any tobacco products, including cigarettes, chewing tobacco, and electronic cigarettes.  If you have any questions. Other tests that may be performed during your first trimester  include:  Blood tests to find your blood type and to check for the presence of any previous infections. The tests will also be used to check for low iron levels (anemia) and protein on red blood cells (Rh antibodies). Depending on your risk factors, or if you previously had diabetes during pregnancy, you may have tests to check for high blood sugar that affects pregnant women (gestational diabetes).  Urine tests to check for infections, diabetes, or protein in the urine.  An ultrasound to confirm the proper growth and development of the baby.  Fetal screens for spinal cord problems (spina bifida) and   Down syndrome.  HIV (human immunodeficiency virus) testing. Routine prenatal testing includes screening for HIV, unless you choose not to have this test.  You may need other tests to make sure you and the baby are doing well. Follow these instructions at home: Medicines  Follow your health care provider's instructions regarding medicine use. Specific medicines may be either safe or unsafe to take during pregnancy.  Take a prenatal vitamin that contains at least 600 micrograms (mcg) of folic acid.  If you develop constipation, try taking a stool softener if your health care provider approves. Eating and drinking   Eat a balanced diet that includes fresh fruits and vegetables, whole grains, good sources of protein such as meat, eggs, or tofu, and low-fat dairy. Your health care provider will help you determine the amount of weight gain that is right for you.  Avoid raw meat and uncooked cheese. These carry germs that can cause birth defects in the baby.  Eating four or five small meals rather than three large meals a day may help relieve nausea and vomiting. If you start to feel nauseous, eating a few soda crackers can be helpful. Drinking liquids between meals, instead of during meals, also seems to help ease nausea and vomiting.  Limit foods that are high in fat and processed sugars, such  as fried and sweet foods.  To prevent constipation: ? Eat foods that are high in fiber, such as fresh fruits and vegetables, whole grains, and beans. ? Drink enough fluid to keep your urine clear or pale yellow. Activity  Exercise only as directed by your health care provider. Most women can continue their usual exercise routine during pregnancy. Try to exercise for 30 minutes at least 5 days a week. Exercising will help you: ? Control your weight. ? Stay in shape. ? Be prepared for labor and delivery.  Experiencing pain or cramping in the lower abdomen or lower back is a good sign that you should stop exercising. Check with your health care provider before continuing with normal exercises.  Try to avoid standing for long periods of time. Move your legs often if you must stand in one place for a long time.  Avoid heavy lifting.  Wear low-heeled shoes and practice good posture.  You may continue to have sex unless your health care provider tells you not to. Relieving pain and discomfort  Wear a good support bra to relieve breast tenderness.  Take warm sitz baths to soothe any pain or discomfort caused by hemorrhoids. Use hemorrhoid cream if your health care provider approves.  Rest with your legs elevated if you have leg cramps or low back pain.  If you develop varicose veins in your legs, wear support hose. Elevate your feet for 15 minutes, 3-4 times a day. Limit salt in your diet. Prenatal care  Schedule your prenatal visits by the twelfth week of pregnancy. They are usually scheduled monthly at first, then more often in the last 2 months before delivery.  Write down your questions. Take them to your prenatal visits.  Keep all your prenatal visits as told by your health care provider. This is important. Safety  Wear your seat belt at all times when driving.  Make a list of emergency phone numbers, including numbers for family, friends, the hospital, and police and fire  departments. General instructions  Ask your health care provider for a referral to a local prenatal education class. Begin classes no later than the beginning of month 6 of your   pregnancy.  Ask for help if you have counseling or nutritional needs during pregnancy. Your health care provider can offer advice or refer you to specialists for help with various needs.  Do not use hot tubs, steam rooms, or saunas.  Do not douche or use tampons or scented sanitary pads.  Do not cross your legs for long periods of time.  Avoid cat litter boxes and soil used by cats. These carry germs that can cause birth defects in the baby and possibly loss of the fetus by miscarriage or stillbirth.  Avoid all smoking, herbs, alcohol, and medicines not prescribed by your health care provider. Chemicals in these products affect the formation and growth of the baby.  Do not use any products that contain nicotine or tobacco, such as cigarettes and e-cigarettes. If you need help quitting, ask your health care provider. You may receive counseling support and other resources to help you quit.  Schedule a dentist appointment. At home, brush your teeth with a soft toothbrush and be gentle when you floss. Contact a health care provider if:  You have dizziness.  You have mild pelvic cramps, pelvic pressure, or nagging pain in the abdominal area.  You have persistent nausea, vomiting, or diarrhea.  You have a bad smelling vaginal discharge.  You have pain when you urinate.  You notice increased swelling in your face, hands, legs, or ankles.  You are exposed to fifth disease or chickenpox.  You are exposed to German measles (rubella) and have never had it. Get help right away if:  You have a fever.  You are leaking fluid from your vagina.  You have spotting or bleeding from your vagina.  You have severe abdominal cramping or pain.  You have rapid weight gain or loss.  You vomit blood or material that  looks like coffee grounds.  You develop a severe headache.  You have shortness of breath.  You have any kind of trauma, such as from a fall or a car accident. Summary  The first trimester of pregnancy is from week 1 until the end of week 13 (months 1 through 3).  Your body goes through many changes during pregnancy. The changes vary from woman to woman.  You will have routine prenatal visits. During those visits, your health care provider will examine you, discuss any test results you may have, and talk with you about how you are feeling. This information is not intended to replace advice given to you by your health care provider. Make sure you discuss any questions you have with your health care provider. Document Revised: 03/22/2017 Document Reviewed: 03/21/2016 Elsevier Patient Education  2020 Elsevier Inc.  

## 2019-10-13 NOTE — Progress Notes (Signed)
Subjective:    [name] [name] is a [id number] [redacted]w[redacted]d being seen today for her first obstetrical visit.  Her obstetrical history is significant for history of intermittent hypertension outside of pregnancy., smoking., history of PIH, hx GBS.    Patient does intend to breast feed. Pregnancy history fully reviewed.  Patient reports one episode of post coital bleeding.  Vitals:   10/13/19 1029  BP: 123/70  Pulse: 84  Weight: 145 lb (65.8 kg)    HISTORY: OB History  Gravida Para Term Preterm AB Living  5 3 3   1 3   SAB TAB Ectopic Multiple Live Births  1       3    # Outcome Date GA Lbr Len/2nd Weight Sex Delivery Anes PTL Lv  5 Current           4 Term 11/25/11 [redacted]w[redacted]d 15:58 / 00:08 6 lb 13.4 oz (3.1 kg) M Vag-Spont EPI  LIV     Birth Comments: occult cord noted at delivery  3 SAB 2012 [redacted]w[redacted]d   U    DEC     Birth Comments: miscarriage  2 Term 06/22/09 [redacted]w[redacted]d   F Vag-Spont EPI  LIV  1 Term 11/27/07    F Vag-Spont EPI  LIV   Past Medical History:  Diagnosis Date  . 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 Group B Streptococcus carrier, +RV culture, currently pregnant 10/29/2011   Needs intrapartum prophylaxis   . Late prenatal care 08/08/2011   Started [place] at 25 weeks   . NSVD (normal spontaneous vaginal delivery) 11/25/2011  . Pregnancy induced hypertension 2009  . Supervision of normal pregnancy 08/08/2011   Too late for genetic testing. 1 hour glucola 78.  Normal anatomy scan. Plans to bottle feed. She wants Nexplanon for birth control    . Tobacco smoking complicating pregnancy 11/02/4578   Reports smoking 3 cigarettes/week.  Encouraged to quit, resources reviewed.    Past Surgical History:  Procedure Laterality Date  . DILATION AND CURETTAGE OF UTERUS     Family History  Problem Relation Age of Onset  . Anesthesia problems Neg Hx   . Hypotension Neg Hx   . Malignant hyperthermia Neg Hx   . Pseudochol deficiency Neg Hx      Exam    Uterus:     Pelvic Exam:    Perineum: Normal Perineum    Vulva: Bartholin's, Urethra, Skene's normal   Vagina:  normal mucosa   pH:    Cervix: no cervical motion tenderness   Adnexa: normal adnexa and no mass, fullness, tenderness   Bony Pelvis: gynecoid  System: Breast:  normal appearance, no masses or tenderness   Skin: normal coloration and turgor, no rashes    Neurologic: oriented, grossly non focal   Extremities: normal strength, tone, and muscle mass   HEENT neck supple with midline trachea   Mouth/Teeth mucous membranes moist, pharynx normal without lesions   Neck supple and no masses   Cardiovascular: regular rate and rhythm   Respiratory:  appears well, vitals normal, no respiratory distress, acyanotic, normal RR, ear and throat exam is normal, neck free of mass or lymphadenopathy, chest clear, no wheezing, crepitations, rhonchi, normal symmetric air entry   Abdomen: soft, non-tender; bowel sounds normal; no masses,  no organomegaly   Urinary: urethral meatus normal      Assessment:    Pregnancy: [id number] Patient Active Problem List   Diagnosis Date Noted  . Supervision of other normal pregnancy, antepartum 10/13/2019  . Encounter for IUD  removal 10/22/2018  . Elevated blood pressure reading 10/22/2018  . Closed fracture of nasal bone 02/24/2017  . Subarachnoid hemorrhage (HCC) 02/24/2017        Plan:  Problem list reviewed and updated. Genetic Screening discussed Panorama: requested.  Ultrasound discussed; fetal survey: requested.  50% of 30 min visit spent on counseling and coordination of care.  Initial labs drawn. Continue prenatal vitamins. Discussed and offered genetic screening options, including Quad screen/AFP, NIPS testing, and option to decline testing. Benefits/risks/alternatives reviewed. Pt aware that anatomy US is form of genetic screening with lower accuracy in detecting trisomies than blood work.  Pt chooses genetic screening today. NIPS: ordered. Ultrasound discussed; fetal anatomic survey:  ordered. Problem list reviewed and updated. The nature of [facility] with multiple MDs and other Advanced Practice Providers was explained to patient; also emphasized that residents, students are part of our team. Routine obstetric precautions reviewed.  Follow up in 4 weeks.  [name] 10/13/2019

## 2019-10-14 ENCOUNTER — Telehealth: Payer: Self-pay

## 2019-10-14 LAB — CYTOLOGY - PAP
Chlamydia: NEGATIVE
Comment: NEGATIVE
Comment: NEGATIVE
Comment: NORMAL
Diagnosis: NEGATIVE
High risk HPV: NEGATIVE
Neisseria Gonorrhea: NEGATIVE

## 2019-10-14 NOTE — Telephone Encounter (Signed)
Called and left patient message to return call to office. Per [name], CNM she needs to start baby aspirin daily due to hx of hypertension outside of pregnancy. Armandina Stammer RN

## 2019-10-15 LAB — CBC/D/PLT+RPR+RH+ABO+RUB AB...
Antibody Screen: NEGATIVE
Basophils Absolute: 0 10*3/uL (ref 0.0–0.2)
Basos: 1 %
EOS (ABSOLUTE): 0.1 10*3/uL (ref 0.0–0.4)
Eos: 1 %
HCV Ab: <0.1 s/co ratio (ref 0.0–0.9)
HIV Screen 4th Generation wRfx: NONREACTIVE
Hematocrit: 42 % (ref 34.0–46.6)
Hemoglobin: 13.5 g/dL (ref 11.1–15.9)
Hepatitis B Surface Ag: NEGATIVE
Immature Grans (Abs): 0 10*3/uL (ref 0.0–0.1)
Immature Granulocytes: 1 %
Lymphocytes Absolute: 2 10*3/uL (ref 0.7–3.1)
Lymphs: 32 %
MCH: 29.2 pg (ref 26.6–33.0)
MCHC: 32.1 g/dL (ref 31.5–35.7)
MCV: 91 fL (ref 79–97)
Monocytes Absolute: 0.6 10*3/uL (ref 0.1–0.9)
Monocytes: 9 %
Neutrophils Absolute: 3.6 10*3/uL (ref 1.4–7.0)
Neutrophils: 56 %
Platelets: 349 10*3/uL (ref 150–450)
RBC: 4.63 x10E6/uL (ref 3.77–5.28)
RDW: 13.6 % (ref 11.7–15.4)
RPR Ser Ql: NONREACTIVE
Rh Factor: POSITIVE
Rubella Antibodies, IGG: 8.85 index (ref >0.99)
WBC: 6.2 10*3/uL (ref 3.4–10.8)

## 2019-10-15 LAB — HEMOGLOBPATHY+FER W/A THAL RFX
Ferritin: 138 ng/mL (ref 15–150)
Hgb A2: 3 % (ref 1.8–3.2)
Hgb A: 97 % (ref 96.4–98.8)
Hgb F: 0 % (ref 0.0–2.0)
Hgb S: 0 %

## 2019-10-15 LAB — HCV INTERPRETATION

## 2019-10-15 LAB — URINE CULTURE, OB REFLEX

## 2019-10-15 LAB — CULTURE, OB URINE

## 2019-10-15 NOTE — Telephone Encounter (Signed)
Patient returned call to office and explained that she should start a baby aspirin daily due to her history of high blood pressures. Patient states understanding. Patient made aware that it is 81 mg asiprin is over the counter. Armandina Stammer RN

## 2019-10-19 NOTE — Progress Notes (Signed)
Patient called and made aware that panorama and horizion results are within normal limits. Patient also made aware that baby is female. Armandina Stammer RN

## 2019-11-10 ENCOUNTER — Encounter: Payer: Medicaid Other | Admitting: Advanced Practice Midwife

## 2019-12-08 ENCOUNTER — Ambulatory Visit: Payer: Medicaid Other | Attending: Advanced Practice Midwife

## 2019-12-11 ENCOUNTER — Telehealth: Payer: Self-pay

## 2019-12-11 NOTE — Telephone Encounter (Signed)
Patient called to check on her in regards to her OB care (missed ultrasound appointment this week).   Patient states she moved out of the [place] (to [facility] She is receiving care there.  Patient states she may move back to [facility] before the delivery of baby and will call us if she needs to return for care. Armandina Stammer RN

## 2021-04-03 ENCOUNTER — Ambulatory Visit: Payer: Medicaid Other | Admitting: Podiatry

## 2021-04-05 ENCOUNTER — Other Ambulatory Visit: Payer: Self-pay

## 2021-04-05 ENCOUNTER — Ambulatory Visit
Admission: EM | Admit: 2021-04-05 | Discharge: 2021-04-05 | Disposition: A | Discharge status: Home / Self Care | Payer: Medicaid Other | Attending: Emergency Medicine | Admitting: Emergency Medicine

## 2021-04-05 DIAGNOSIS — B3731 Acute candidiasis of vulva and vagina: Secondary | ICD-10-CM | POA: Diagnosis not present

## 2021-04-05 DIAGNOSIS — R8290 Unspecified abnormal findings in urine: Secondary | ICD-10-CM | POA: Diagnosis not present

## 2021-04-05 DIAGNOSIS — R82998 Other abnormal findings in urine: Secondary | ICD-10-CM | POA: Diagnosis not present

## 2021-04-05 DIAGNOSIS — N898 Other specified noninflammatory disorders of vagina: Secondary | ICD-10-CM | POA: Diagnosis not present

## 2021-04-05 LAB — POCT URINALYSIS DIP (MANUAL ENTRY)
Bilirubin, UA: NEGATIVE
Blood, UA: NEGATIVE
Glucose, UA: NEGATIVE mg/dL
Ketones, POC UA: NEGATIVE mg/dL
Nitrite, UA: NEGATIVE
Spec Grav, UA: 1.02 (ref 1.010–1.025)
Urobilinogen, UA: 0.2 E.U./dL
pH, UA: 7.5 (ref 5.0–8.0)

## 2021-04-05 LAB — POCT URINE PREGNANCY: Preg Test, Ur: NEGATIVE

## 2021-04-05 MED ORDER — FLUCONAZOLE 150 MG PO TABS: ORAL_TABLET | ORAL | 0 refills | Status: DC

## 2021-04-05 MED ORDER — METRONIDAZOLE 0.75 % VA GEL: 1.0000 | Freq: Every day | VAGINAL | 0 refills | Status: DC

## 2021-04-05 NOTE — ED Triage Notes (Signed)
Pt reports of having vaginal discharge (white), she also reports having slight lower back pain.  Started: 2 days ago

## 2021-04-05 NOTE — Discharge Instructions (Addendum)
Please begin diflucan as prescribed for your vaginal discharge symptoms which are most likely due to vaginal yeast infection.  Please also begin metronidazole vaginal gel for possible bacterial vaginosis, 1 applicator full inserted into your vagina every night for 5 nights.  The results of your STD testing today will be made available to you once received.  They will initially be posted to your MyChart and, if any of your results are abnormal, you will receive a phone call with those results along with further instructions regarding treatment.

## 2021-04-05 NOTE — ED Provider Notes (Signed)
[facility]    CSN: [id number] Arrival date & time: 04/05/21  1450    HISTORY  No chief complaint on file.  HPI [name] [name] is a 30 y.o. female. Patient complains of a 2-day history of white vaginal discharge and slight lower back pain.  The history is provided by the patient.  Past Medical History:  Diagnosis Date   Chorioamnionitis 11/25/2011   Group B Streptococcus carrier, +RV culture, currently pregnant 10/29/2011   Needs intrapartum prophylaxis    Late prenatal care 08/08/2011   Started PNC at 25 weeks    NSVD (normal spontaneous vaginal delivery) 11/25/2011   Pregnancy induced hypertension 2009   Supervision of normal pregnancy 08/08/2011   Too late for genetic testing. 1 hour glucola 78.  Normal anatomy scan. Plans to bottle feed. She wants Nexplanon for birth control     Tobacco smoking complicating pregnancy 08/08/2011   Reports smoking 3 cigarettes/week.  Encouraged to quit, resources reviewed.    Patient Active Problem List   Diagnosis Date Noted   Supervision of other normal pregnancy, antepartum 10/13/2019   History of gestational hypertension 10/13/2019   Smoker 10/13/2019   Encounter for IUD removal 10/22/2018   Elevated blood pressure reading 10/22/2018   Closed fracture of nasal bone 02/24/2017   Subarachnoid hemorrhage (HCC) 02/24/2017   Past Surgical History:  Procedure Laterality Date   DILATION AND CURETTAGE OF UTERUS     OB History     Gravida  5   Para  3   Term  3   Preterm      AB  1   Living  3      SAB  1   IAB      Ectopic      Multiple      Live Births  3          Home Medications    Prior to Admission medications   Not on File   Family History Family History  Problem Relation Age of Onset   Anesthesia problems Neg Hx    Hypotension Neg Hx    Malignant hyperthermia Neg Hx    Pseudochol deficiency Neg Hx    Social History Social History   Tobacco Use   Smoking status: Some Days    Packs/day:  0.25    Types: Cigarettes    Last attempt to quit: 09/21/2011    Years since quitting: 9.5   Smokeless tobacco: Never  Substance Use Topics   Alcohol use: No   Drug use: No   Allergies   Patient has no known allergies.  Review of Systems Review of Systems Pertinent findings noted in history of present illness.   Physical Exam Triage Vital Signs ED Triage Vitals  Enc Vitals Group     BP 02/17/21 0827 (!) 147/82     Pulse Rate 02/17/21 0827 72     Resp 02/17/21 0827 18     Temp 02/17/21 0827 98.3 F (36.8 C)     Temp Source 02/17/21 0827 Oral     SpO2 02/17/21 0827 98 %     Weight --      Height --      Head Circumference --      Peak Flow --      Pain Score 02/17/21 0826 5     Pain Loc --      Pain Edu? --      Excl. in [place]? --   No data found.  Updated Vital Signs Pulse (!) (P) 57    Temp (P) 98.9 F (37.2 C) (Oral)    Resp (P) 18    LMP 03/27/2021 (Approximate)    SpO2 (P) 98%   Physical Exam Vitals and nursing note reviewed.  Constitutional:      General: She is not in acute distress.    Appearance: Normal appearance. She is not ill-appearing.  HENT:     Head: Normocephalic and atraumatic.  Eyes:     General: Lids are normal.        Right eye: No discharge.        Left eye: No discharge.     Extraocular Movements: Extraocular movements intact.     Conjunctiva/sclera: Conjunctivae normal.     Right eye: Right conjunctiva is not injected.     Left eye: Left conjunctiva is not injected.  Neck:     Trachea: Trachea and phonation normal.  Cardiovascular:     Rate and Rhythm: Normal rate and regular rhythm.     Pulses: Normal pulses.     Heart sounds: Normal heart sounds. No murmur heard.   No friction rub. No gallop.  Pulmonary:     Effort: Pulmonary effort is normal. No accessory muscle usage, prolonged expiration or respiratory distress.     Breath sounds: Normal breath sounds. No stridor, decreased air movement or transmitted upper airway sounds. No  decreased breath sounds, wheezing, rhonchi or rales.  Chest:     Chest wall: No tenderness.  Genitourinary:    Comments: Pt politely declines GU exam, pt did provide a swab for testing.   Musculoskeletal:        General: Normal range of motion.     Cervical back: Normal range of motion and neck supple. Normal range of motion.  Lymphadenopathy:     Cervical: No cervical adenopathy.  Skin:    General: Skin is warm and dry.     Findings: No erythema or rash.  Neurological:     General: No focal deficit present.     Mental Status: She is alert and oriented to person, place, and time.  Psychiatric:        Mood and Affect: Mood normal.        Behavior: Behavior normal.    Visual Acuity Right Eye Distance:   Left Eye Distance:   Bilateral Distance:    Right Eye Near:   Left Eye Near:    Bilateral Near:     UC Couse / Diagnostics / Procedures:    EKG  Radiology No results found.  Procedures Procedures (including critical care time)  UC Diagnoses / Final Clinical Impressions(s)   I have reviewed the triage vital signs and the nursing notes.  Pertinent labs & imaging results that were available during my care of the patient were reviewed by me and considered in my medical decision making (see chart for details).   Final diagnoses:  Vaginal discharge  Vulvovaginal candidiasis   Based on patient's chief complaints, we will treat patient empirically for both yeast infection and bacterial vaginitis.  Patient advised we will notify her of the results of her STD screening once received and if any further treatment is needed this will be provided for her.  Return precautions advised.  ED Prescriptions     Medication Sig Dispense Auth. Provider   fluconazole (DIFLUCAN) 150 MG tablet Take 1 tablet today.  Take second tablet 3 days later. 2 tablet Lynden Oxford Scales, PA-C   metroNIDAZOLE (METROGEL) 0.75 %  vaginal gel Place 1 Applicatorful vaginally at bedtime for 5 days.  Abstain from sexual intercourse until treatment is complete 70 g Lynden Oxford Scales, PA-C      PDMP not reviewed this encounter.  Pending results:  Labs Reviewed  POCT URINALYSIS DIP (MANUAL ENTRY)  POCT URINE PREGNANCY  CERVICOVAGINAL ANCILLARY ONLY    Medications Ordered in UC: Medications - No data to display  Disposition Upon Discharge:  Condition: stable for discharge home  Patient presents today with concerns for exposure to sexually transmitted disease, requesting testing.  STD screening was performed as indicated.  Patient has been advised that the results of screening will be made available to them via MyChart and, if there are any positive findings, they will be contacted by phone, recommendations for treatment will be advised and prescriptions will be provided as indicated based on clinical guidelines.  Patient has also been advised that if treatment is recommended, they should abstain from sexual intercourse of all forms until treatment is complete.  Patient has further been advised that once treatment is complete, they have not had a complete resolution of their symptoms, if any, they should continue to abstain from sexual intercourse with all forms and follow-up with her primary care provider or return to urgent care for repeat testing.  As such, the patient has been evaluated and assessed, work-up was performed and treatment was provided in alignment with urgent care protocols and evidence based medicine.  Patient/parent/caregiver has been advised that the patient may require follow up for further testing and/or treatment if the symptoms continue in spite of treatment, as clinically indicated and appropriate.  Routine symptom specific, illness specific and/or disease specific instructions were discussed with the patient and/or caregiver at length.  Prevention strategies for avoiding STD exposure were also discussed.  The patient will follow up with their current PCP if and  as advised. If the patient does not currently have a PCP we will assist them in obtaining one.   The patient may need specialty follow up if the symptoms continue, in spite of conservative treatment and management, for further workup, evaluation, consultation and treatment as clinically indicated and appropriate.  Patient/parent/caregiver verbalized understanding and agreement of plan as discussed.  All questions were addressed during visit.  Please see discharge instructions below for further details of plan.  Discharge Instructions:   Discharge Instructions      Please begin diflucan as prescribed for your vaginal discharge symptoms which are most likely due to vaginal yeast infection.  Please also begin metronidazole vaginal gel for possible bacterial vaginosis, 1 applicator full inserted into your vagina every night for 5 nights.  The results of your STD testing today will be made available to you once received.  They will initially be posted to your MyChart and, if any of your results are abnormal, you will receive a phone call with those results along with further instructions regarding treatment.          Lynden Oxford Scales, PA-C 04/05/21 1525

## 2021-04-07 ENCOUNTER — Telehealth (HOSPITAL_COMMUNITY): Payer: Self-pay | Admitting: Emergency Medicine

## 2021-04-07 LAB — URINE CULTURE: Culture: NO GROWTH

## 2021-04-07 LAB — CERVICOVAGINAL ANCILLARY ONLY
Bacterial Vaginitis (gardnerella): POSITIVE — AB
Candida Glabrata: NEGATIVE
Candida Vaginitis: NEGATIVE
Chlamydia: NEGATIVE
Comment: NEGATIVE
Comment: NEGATIVE
Comment: NEGATIVE
Comment: NEGATIVE
Comment: NEGATIVE
Comment: NORMAL
Neisseria Gonorrhea: NEGATIVE
Trichomonas: POSITIVE — AB

## 2021-04-07 MED ORDER — METRONIDAZOLE 500 MG PO TABS: 500.0000 mg | ORAL_TABLET | Freq: Two times a day (BID) | ORAL | 0 refills | Status: DC

## 2021-05-03 ENCOUNTER — Ambulatory Visit (HOSPITAL_BASED_OUTPATIENT_CLINIC_OR_DEPARTMENT_OTHER)
Admission: RE | Admit: 2021-05-03 | Discharge: 2021-05-03 | Disposition: A | Discharge status: Home / Self Care | Payer: Medicaid Other | Source: Ambulatory Visit | Attending: Emergency Medicine | Admitting: Emergency Medicine

## 2021-05-03 ENCOUNTER — Ambulatory Visit
Admission: EM | Admit: 2021-05-03 | Discharge: 2021-05-03 | Disposition: A | Discharge status: Home / Self Care | Payer: Medicaid Other | Attending: Emergency Medicine | Admitting: Emergency Medicine

## 2021-05-03 ENCOUNTER — Other Ambulatory Visit: Payer: Self-pay

## 2021-05-03 DIAGNOSIS — M542 Cervicalgia: Secondary | ICD-10-CM | POA: Diagnosis not present

## 2021-05-03 DIAGNOSIS — M47812 Spondylosis without myelopathy or radiculopathy, cervical region: Secondary | ICD-10-CM | POA: Insufficient documentation

## 2021-05-03 MED ORDER — IBUPROFEN 800 MG PO TABS
800.0000 mg | ORAL_TABLET | Freq: Once | ORAL | Status: AC
  Administered 2021-05-03: 800 mg via ORAL
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
## 2021-05-03 NOTE — Discharge Instructions (Signed)
Please go to the [facility] location to have your x-ray done of your neck.  Based on those results, I will send prescriptions for either steroids or ibuprofen to your pharmacy.  The results of your x-ray are usually available about an hour after imaging is complete.
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
## 2021-07-14 ENCOUNTER — Ambulatory Visit
Admission: EM | Admit: 2021-07-14 | Discharge: 2021-07-14 | Disposition: A | Discharge status: Home / Self Care | Payer: Medicaid Other | Attending: Physician Assistant | Admitting: Physician Assistant

## 2021-07-14 ENCOUNTER — Other Ambulatory Visit: Payer: Self-pay

## 2021-07-14 DIAGNOSIS — N898 Other specified noninflammatory disorders of vagina: Secondary | ICD-10-CM | POA: Diagnosis present

## 2021-07-14 DIAGNOSIS — R030 Elevated blood-pressure reading, without diagnosis of hypertension: Secondary | ICD-10-CM

## 2021-07-14 DIAGNOSIS — B353 Tinea pedis: Secondary | ICD-10-CM

## 2021-07-14 DIAGNOSIS — R21 Rash and other nonspecific skin eruption: Secondary | ICD-10-CM | POA: Insufficient documentation

## 2021-07-14 DIAGNOSIS — Z113 Encounter for screening for infections with a predominantly sexual mode of transmission: Secondary | ICD-10-CM | POA: Diagnosis present

## 2021-07-14 LAB — POCT URINE PREGNANCY: Preg Test, Ur: NEGATIVE

## 2021-07-14 MED ORDER — CICLOPIROX OLAMINE 0.77 % EX CREA: TOPICAL_CREAM | Freq: Two times a day (BID) | CUTANEOUS | 1 refills | Status: DC

## 2021-07-14 NOTE — ED Provider Notes (Signed)
?[facility] ? ? ? ?CSN: [id number] ?Arrival date & time: 07/14/21  7939 ? ? ?  ? ?History   ?Chief Complaint ?Chief Complaint  ?Patient presents with  ? Vaginal Discharge  ? ? ?HPI ?[name] [name] is a 31 y.o. female.  ? ?Patient presents today with several concerns.  She reports a 3-day history of vaginal discharge which she describes as clear/white without odor.  She is sexually active with female partners and does not consistently use condoms.  She denies any known exposure.  She does report changing her soap several times recently and wonders if this could be contributing to symptoms.  Denies any recent antibiotic use.  She does report occasional vaginal irritation but denies any significant pain, abdominal pain, fever, nausea, vomiting.  She is confident that she is not pregnant.  She has not tried any over-the-counter medication for symptom management. ? ?In addition, she reports a month-long history of wound/pruritic region of her left small toe.  She reports symptoms began after she wear her sneakers without socks.  She has been using hydrocortisone cream without improvement of symptoms.  She denies history of diabetes.  She has not seen a specialist in the past.  She denies any spread of lesion. ? ? ?Past Medical History:  ?Diagnosis Date  ? 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 Group B Streptococcus carrier, +RV culture, currently pregnant 10/29/2011  ? Needs intrapartum prophylaxis   ? Late prenatal care 08/08/2011  ? Started PNC at 25 weeks   ? NSVD (normal spontaneous vaginal delivery) 11/25/2011  ? Pregnancy induced hypertension 2009  ? Supervision of normal pregnancy 08/08/2011  ? Too late for genetic testing. 1 hour glucola 78.  Normal anatomy scan. Plans to bottle feed. She wants Nexplanon for birth control    ? Tobacco smoking complicating pregnancy 08/08/2011  ? Reports smoking 3 cigarettes/week.  Encouraged to quit, resources reviewed.   ? ? ?Patient Active Problem List  ? Diagnosis Date Noted  ?  Supervision of other normal pregnancy, antepartum 10/13/2019  ? History of gestational hypertension 10/13/2019  ? Smoker 10/13/2019  ? Encounter for IUD removal 10/22/2018  ? Elevated blood pressure reading 10/22/2018  ? Closed fracture of nasal bone 02/24/2017  ? Subarachnoid hemorrhage (HCC) 02/24/2017  ? ? ?Past Surgical History:  ?Procedure Laterality Date  ? DILATION AND CURETTAGE OF UTERUS    ? ? ?OB History   ? ? Gravida  ?5  ? Para  ?3  ? Term  ?3  ? Preterm  ?   ? AB  ?1  ? Living  ?3  ?  ? ? SAB  ?1  ? IAB  ?   ? Ectopic  ?   ? Multiple  ?   ? Live Births  ?3  ?   ?  ?  ? ? ? ?Home Medications   ? ?Prior to Admission medications   ?Medication Sig Start Date End Date Taking? Authorizing Provider  ?ciclopirox (LOPROX) 0.77 % cream Apply topically 2 (two) times daily. 07/14/21  Yes Carmyn Hamm, Noberto Retort, PA-C  ? ? ?Family History ?Family History  ?Problem Relation Age of Onset  ? Anesthesia problems Neg Hx   ? Hypotension Neg Hx   ? Malignant hyperthermia Neg Hx   ? Pseudochol deficiency Neg Hx   ? ? ?Social History ?Social History  ? ?Tobacco Use  ? Smoking status: Some Days  ?  Packs/day: 0.25  ?  Types: Cigarettes  ?  Last attempt to quit:  09/21/2011  ?  Years since quitting: 9.8  ? Smokeless tobacco: Never  ?Substance Use Topics  ? Alcohol use: No  ? Drug use: No  ? ? ? ?Allergies   ?Patient has no known allergies. ? ? ?Review of Systems ?Review of Systems  ?Constitutional:  Positive for activity change. Negative for appetite change, fatigue and fever.  ?Respiratory:  Negative for cough and shortness of breath.   ?Cardiovascular:  Negative for chest pain.  ?Gastrointestinal:  Negative for abdominal pain, diarrhea, nausea and vomiting.  ?Genitourinary:  Positive for vaginal discharge. Negative for dysuria, frequency, pelvic pain, urgency, vaginal bleeding and vaginal pain.  ?Skin:  Positive for rash.  ?Neurological:  Negative for dizziness, light-headedness and headaches.  ? ? ?Physical Exam ?Triage Vital Signs ?ED  Triage Vitals [07/14/21 1901]  ?Enc Vitals Group  ?   BP (!) 166/92  ?   Pulse Rate 69  ?   Resp 18  ?   Temp 98.3 ?F (36.8 ?C)  ?   Temp Source Oral  ?   SpO2 97 %  ?   Weight   ?   Height   ?   Head Circumference   ?   Peak Flow   ?   Pain Score 0  ?   Pain Loc   ?   Pain Edu?   ?   Excl. in GC?   ? ?No data found. ? ?Updated Vital Signs ?BP (!) 160/97 (BP Location: Left Arm)   Pulse 69   Temp 98.3 ?F (36.8 ?C) (Oral)   Resp 18   SpO2 97%   Breastfeeding No  ? ?Visual Acuity ?Right Eye Distance:   ?Left Eye Distance:   ?Bilateral Distance:   ? ?Right Eye Near:   ?Left Eye Near:    ?Bilateral Near:    ? ?Physical Exam ?Vitals reviewed.  ?Constitutional:   ?   General: She is awake. She is not in acute distress. ?   Appearance: Normal appearance. She is well-developed. She is not ill-appearing.  ?   Comments: Very pleasant female appears stated age in no acute distress sitting comfortably in exam room  ?HENT:  ?   Head: Normocephalic and atraumatic.  ?Cardiovascular:  ?   Rate and Rhythm: Normal rate and regular rhythm.  ?   Heart sounds: Normal heart sounds, S1 normal and S2 normal. No murmur heard. ?Pulmonary:  ?   Effort: Pulmonary effort is normal.  ?   Breath sounds: Normal breath sounds. No wheezing, rhonchi or rales.  ?   Comments: Clear to auscultation bilaterally ?Abdominal:  ?   General: Bowel sounds are normal.  ?   Palpations: Abdomen is soft.  ?   Tenderness: There is no abdominal tenderness. There is no right CVA tenderness, left CVA tenderness, guarding or rebound.  ?Genitourinary: ?   Comments: Exam deferred ?Feet:  ?   Comments: Area of ulcerated lesion with surrounding desquamation between fourth and fifth toe.  No bleeding or drainage noted.  No streaking or evidence of lymphangitis. ?Psychiatric:     ?   Behavior: Behavior is cooperative.  ? ? ? ?UC Treatments / Results  ?Labs ?(all labs ordered are listed, but only abnormal results are displayed) ?Labs Reviewed  ?HIV ANTIBODY (ROUTINE  TESTING W REFLEX)  ?RPR  ?HEPATITIS C ANTIBODY  ?POCT URINE PREGNANCY  ?CERVICOVAGINAL ANCILLARY ONLY  ? ? ?EKG ? ? ?Radiology ?No results found. ? ?Procedures ?Procedures (including critical care time) ? ?Medications Ordered  in UC ?Medications - No data to display ? ?Initial Impression / Assessment and Plan / UC Course  ?I have reviewed the triage vital signs and the nursing notes. ? ?Pertinent labs & imaging results that were available during my care of the patient were reviewed by me and considered in my medical decision making (see chart for details). ? ?  ? ?STI swab was collected today-results pending.  Discussed symptoms could be related to changing soaps and encouraged her to use hypoallergenic soaps and detergents and wear loosefitting cotton underwear.  Full STI panel including HSV, hepatitis, syphilis obtained today-results pending.  We will contact patient if we need to arrange any treatment based on results.  Discussed the importance of safe sex practices.  Discussed that if she develops any additional symptoms including pelvic pain, abdominal pain, fever, nausea, vomiting she is to return for reevaluation. ? ?Symptoms consistent with tinea pedis.  We will try with Loprox twice daily.  Discussed that she should keep her feet clean and dry.  If symptoms are not improving with medication she is to follow-up with [facility] and was given contact information to call to schedule an appointment.  Discussed that she will need to continue using this medication for minimum of 2 weeks even if symptoms improve sooner to ensure complete treatment.  If she develops any worsening symptoms she is to return for reevaluation. ? ?Blood pressure is elevated today.  It has been elevated for the past several visits.  She denies any chest pain, shortness of breath, headache, vision changes, visual disturbance.  She denies any recent decongestant use.  Recommended that she avoid decongestants, caffeine, sodium.  She  is to monitor her blood pressure at home and if persistently above 140/90 she is to return for reevaluation so we can consider initiation of medication.  She does not currently have a PCP so we will try to establis

## 2021-07-14 NOTE — Discharge Instructions (Signed)
We will contact you if any of your results are positive and we need to arrange any treatment.  If you have any questions please call us.  It is important that you abstain from sex until you receive your results.  If you are positive all partners to be tested and treated as well. ? ?Use Loprox twice daily for minimum of 2 weeks as we discussed.  Keep your feet clean and dry.  If symptoms or not improving please call [facility] to schedule an appointment as we discussed. ? ?Your blood pressure is elevated.  Please monitor this at home and if persistently above 140/90 please return for reevaluation to consider medication.  Avoid decongestants, caffeine, sodium.  I will try to establish you with a primary care but please return here if you are unable to see them within a few weeks we can recheck your blood pressure.  If you develop any chest pain, shortness of breath, headache, vision changes in the setting of high blood pressure you need to go to the emergency room. ?

## 2021-07-14 NOTE — ED Triage Notes (Signed)
Pt c/o clear-whitish thin vaginal discharge ? ?Denies odors, hematuria, lower pelvic pain, lower back pain, frequency, dysuria,  ? ?Onset ~ 3 days ago  ? ?Also c/o non-healing sore between toes on foot  ?

## 2021-07-17 ENCOUNTER — Telehealth (HOSPITAL_COMMUNITY): Payer: Self-pay | Admitting: Emergency Medicine

## 2021-07-17 LAB — CERVICOVAGINAL ANCILLARY ONLY
Bacterial Vaginitis (gardnerella): NEGATIVE
Candida Glabrata: NEGATIVE
Candida Vaginitis: POSITIVE — AB
Chlamydia: NEGATIVE
Comment: NEGATIVE
Comment: NEGATIVE
Comment: NEGATIVE
Comment: NEGATIVE
Comment: NEGATIVE
Comment: NORMAL
Neisseria Gonorrhea: NEGATIVE
Trichomonas: NEGATIVE

## 2021-07-17 MED ORDER — FLUCONAZOLE 150 MG PO TABS: 150.0000 mg | ORAL_TABLET | Freq: Once | ORAL | 0 refills | Status: AC

## 2021-07-18 LAB — HEPATITIS C ANTIBODY: Hep C Virus Ab: NONREACTIVE

## 2021-07-18 LAB — RPR: RPR Ser Ql: NONREACTIVE

## 2021-07-18 LAB — HIV ANTIBODY (ROUTINE TESTING W REFLEX): HIV Screen 4th Generation wRfx: NONREACTIVE

## 2021-07-20 ENCOUNTER — Encounter (HOSPITAL_COMMUNITY): Payer: Self-pay

## 2022-03-15 IMAGING — CR DG CERVICAL SPINE COMPLETE 4+V
6 series · 6 of 6 positions shown · non-contrast
Comparison: 02/23/2017

CLINICAL DATA: History of left occipital condyle fracture

EXAM:
CERVICAL SPINE - COMPLETE 4+ VIEW

[w c-spine lat]
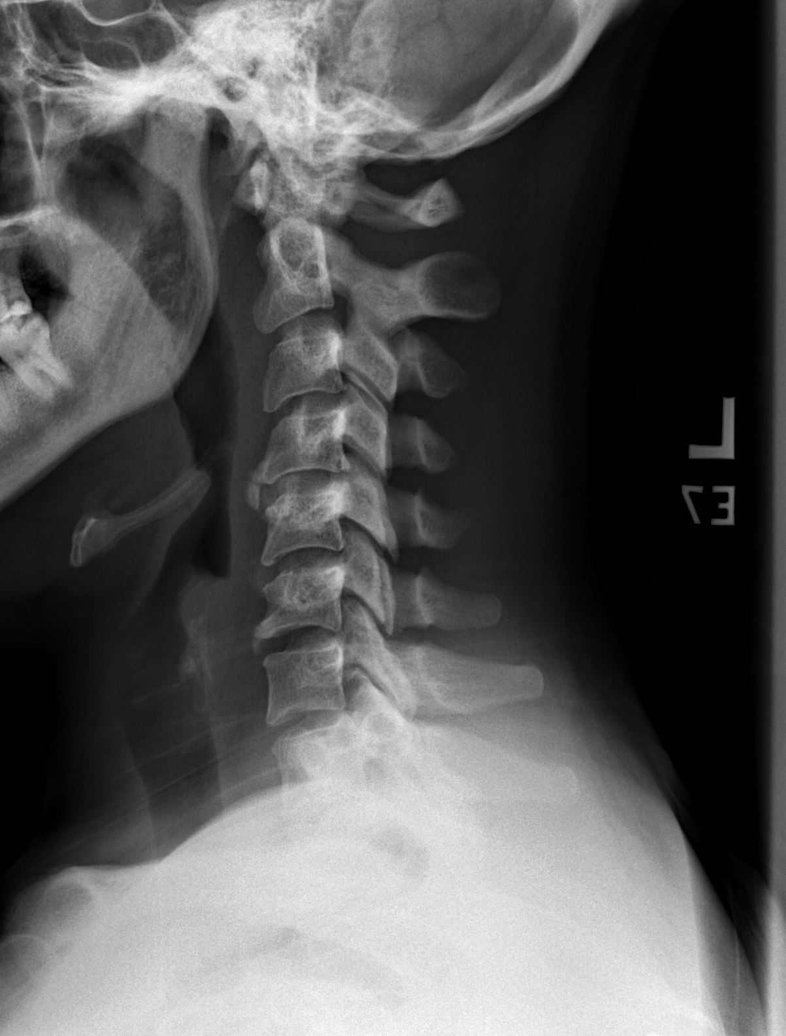

[w c-spine oblique (1 of 2)]
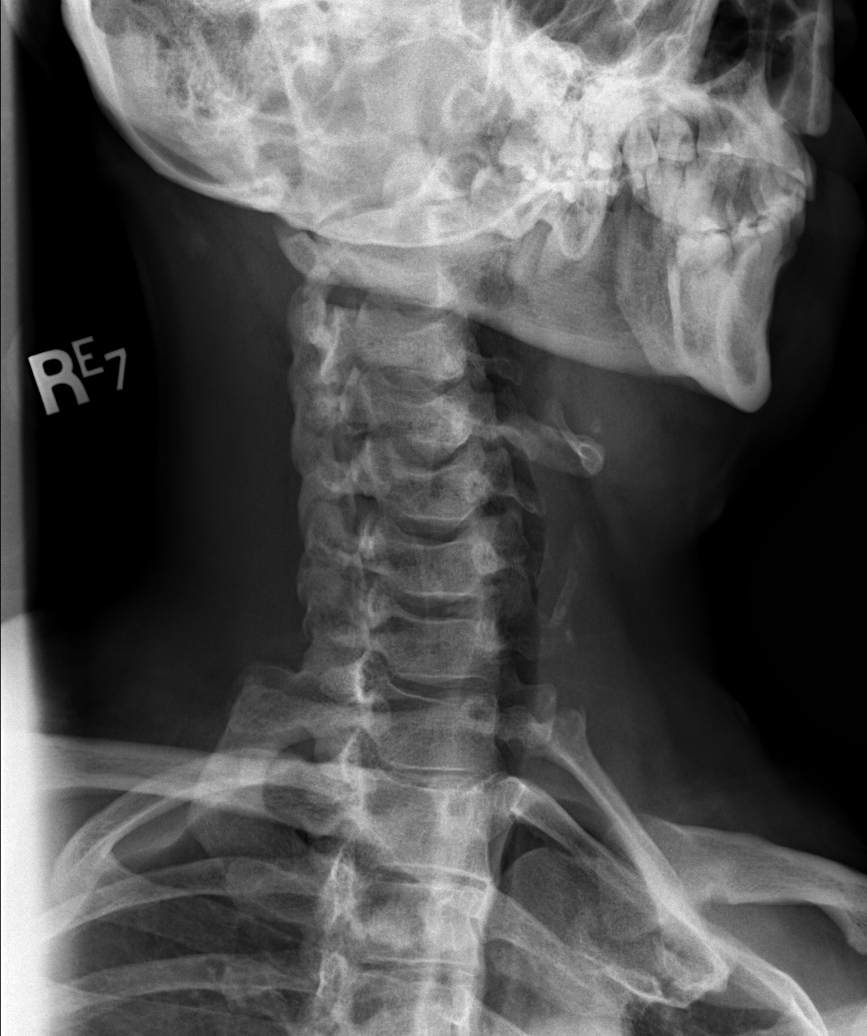

[w c-spine oblique (2 of 2)]
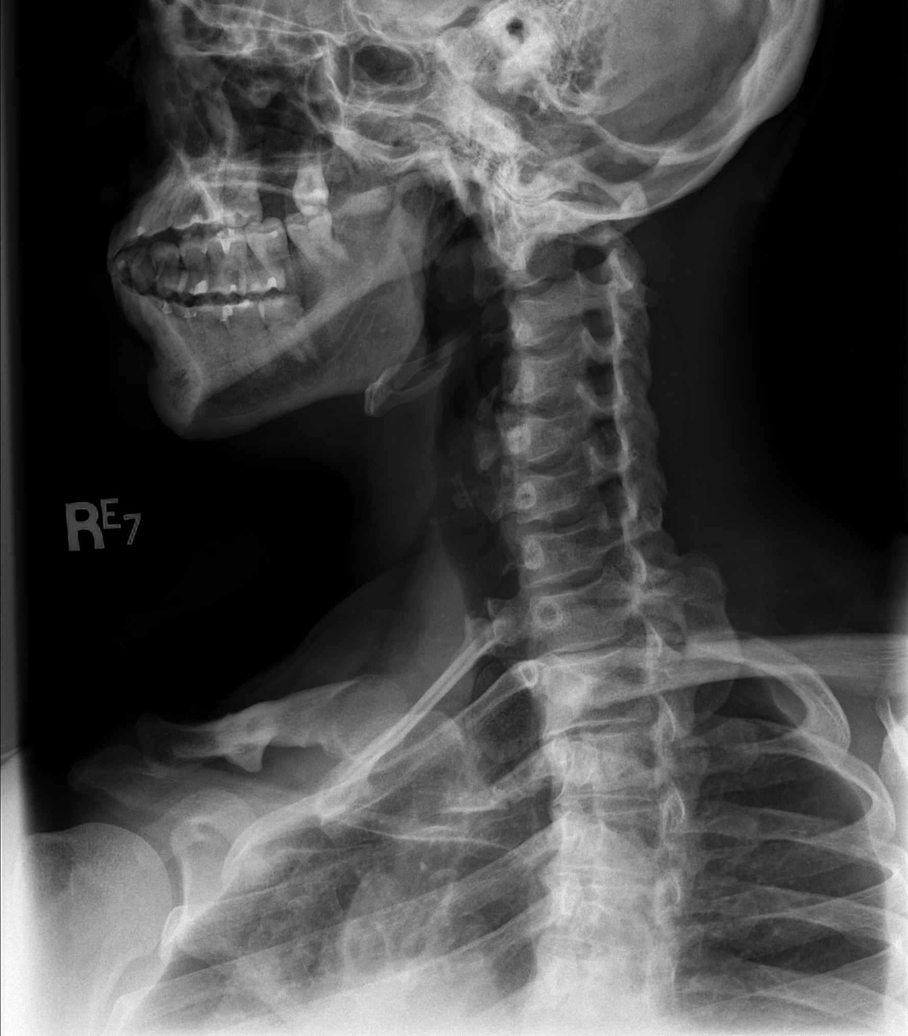

[w c-spine a.p.]
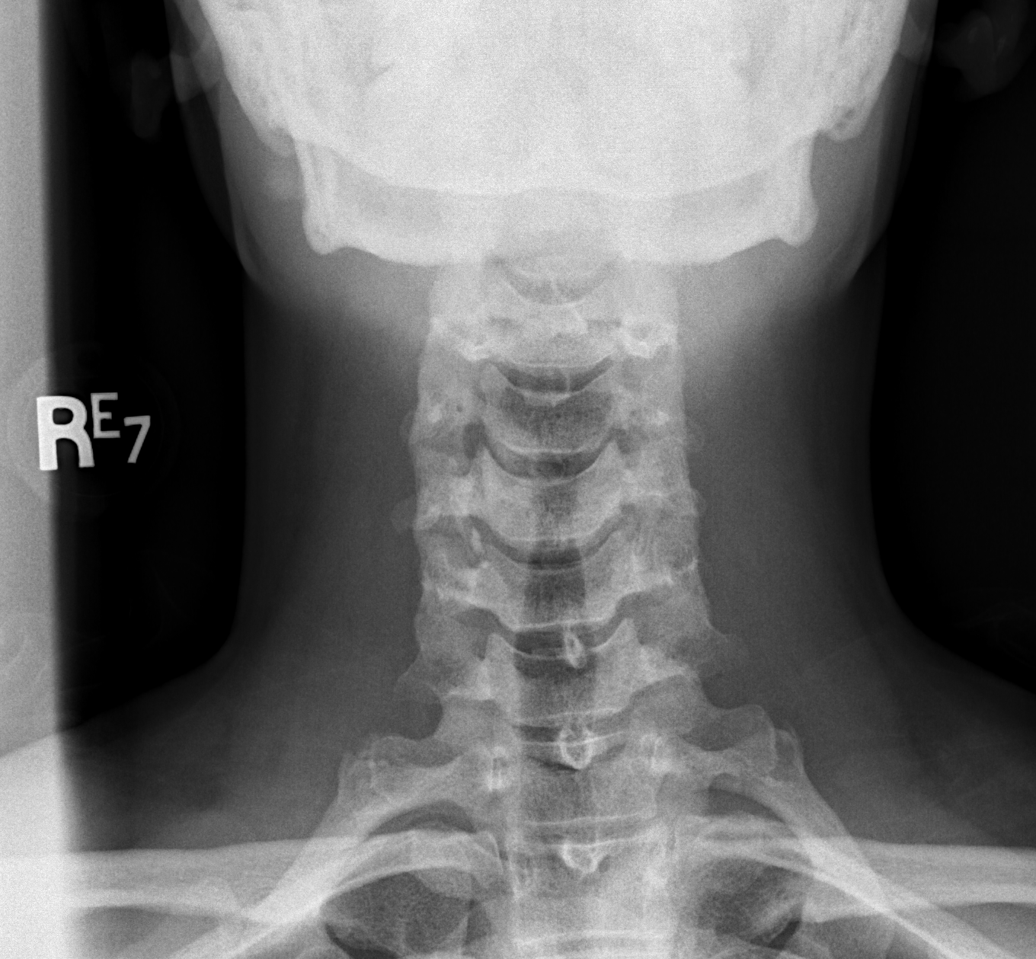

[w c-spine odontoid (1 of 2)]
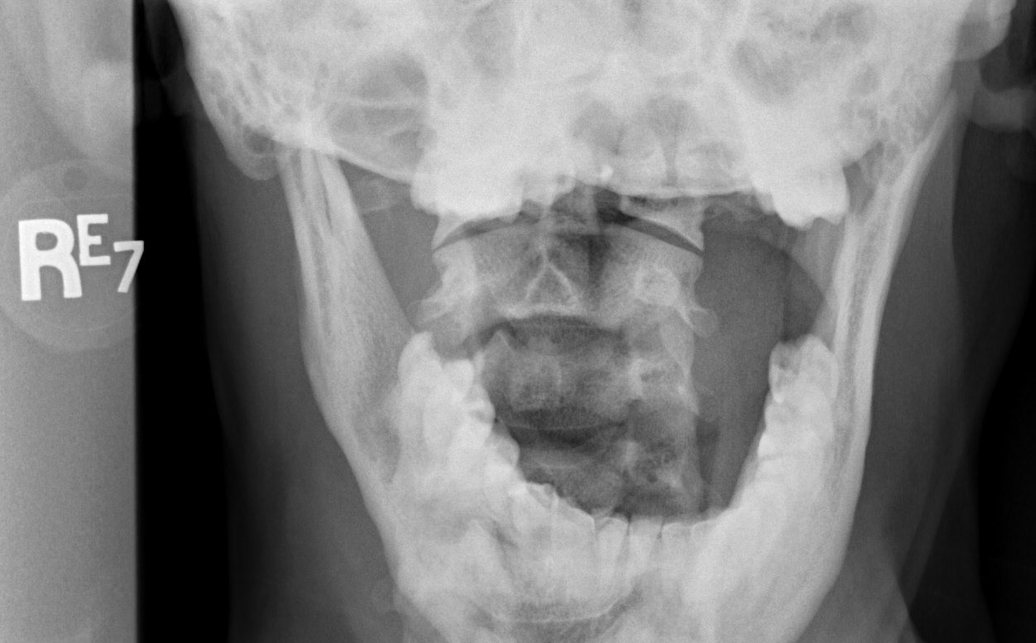

[w c-spine odontoid (2 of 2)]
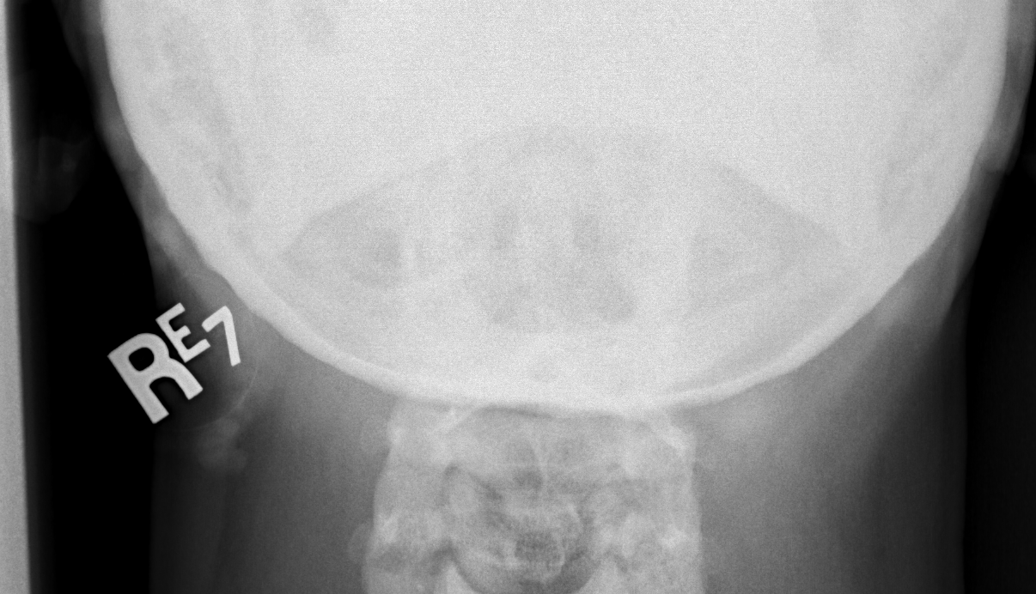

[6 of 6 positions shown; findings below may reference images not displayed]

FINDINGS: Straightening of the cervical spine. Vertebral body heights are
maintained. Mild degenerative osteophytes at C4-C5 and C6-C7.
Lateral masses are within normal limits. The dens is partially
obscured
IMPRESSION: Straightening of the cervical spine with mild degenerative changes.

## 2022-09-16 ENCOUNTER — Other Ambulatory Visit (HOSPITAL_COMMUNITY)
Admission: EM | Admit: 2022-09-16 | Discharge: 2022-09-17 | Disposition: A | Discharge status: Other Acute Inpatient Hospital | Payer: 59 | Attending: Psychiatry | Admitting: Psychiatry

## 2022-09-16 DIAGNOSIS — F319 Bipolar disorder, unspecified: Secondary | ICD-10-CM | POA: Diagnosis not present

## 2022-09-16 DIAGNOSIS — F1590 Other stimulant use, unspecified, uncomplicated: Secondary | ICD-10-CM

## 2022-09-16 DIAGNOSIS — F17210 Nicotine dependence, cigarettes, uncomplicated: Secondary | ICD-10-CM | POA: Insufficient documentation

## 2022-09-16 DIAGNOSIS — Z046 Encounter for general psychiatric examination, requested by authority: Secondary | ICD-10-CM | POA: Diagnosis present

## 2022-09-16 DIAGNOSIS — F17200 Nicotine dependence, unspecified, uncomplicated: Secondary | ICD-10-CM

## 2022-09-16 DIAGNOSIS — F1514 Other stimulant abuse with stimulant-induced mood disorder: Secondary | ICD-10-CM | POA: Insufficient documentation

## 2022-09-16 DIAGNOSIS — F22 Delusional disorders: Secondary | ICD-10-CM | POA: Diagnosis not present

## 2022-09-16 DIAGNOSIS — F1994 Other psychoactive substance use, unspecified with psychoactive substance-induced mood disorder: Secondary | ICD-10-CM | POA: Insufficient documentation

## 2022-09-16 DIAGNOSIS — F603 Borderline personality disorder: Secondary | ICD-10-CM | POA: Insufficient documentation

## 2022-09-16 DIAGNOSIS — F1290 Cannabis use, unspecified, uncomplicated: Secondary | ICD-10-CM

## 2022-09-16 LAB — CBC WITH DIFFERENTIAL/PLATELET
Abs Immature Granulocytes: 0.02 10*3/uL (ref 0.00–0.07)
Basophils Absolute: 0 10*3/uL (ref 0.0–0.1)
Basophils Relative: 1 %
Eosinophils Absolute: 0.1 10*3/uL (ref 0.0–0.5)
Eosinophils Relative: 1 %
HCT: 40.7 % (ref 36.0–46.0)
Hemoglobin: 13.6 g/dL (ref 12.0–15.0)
Immature Granulocytes: 0 %
Lymphocytes Relative: 42 %
Lymphs Abs: 2.4 10*3/uL (ref 0.7–4.0)
MCH: 30.2 pg (ref 26.0–34.0)
MCHC: 33.4 g/dL (ref 30.0–36.0)
MCV: 90.4 fL (ref 80.0–100.0)
Monocytes Absolute: 0.5 10*3/uL (ref 0.1–1.0)
Monocytes Relative: 9 %
Neutro Abs: 2.6 10*3/uL (ref 1.7–7.7)
Neutrophils Relative %: 47 %
Platelets: 288 10*3/uL (ref 150–400)
RBC: 4.5 MIL/uL (ref 3.87–5.11)
RDW: 13.3 % (ref 11.5–15.5)
WBC: 5.6 10*3/uL (ref 4.0–10.5)
nRBC: 0 % (ref 0.0–0.2)

## 2022-09-16 LAB — COMPREHENSIVE METABOLIC PANEL
ALT: 19 U/L (ref 0–44)
AST: 23 U/L (ref 15–41)
Albumin: 4.2 g/dL (ref 3.5–5.0)
Alkaline Phosphatase: 49 U/L (ref 38–126)
Anion gap: 8 (ref 5–15)
BUN: 11 mg/dL (ref 6–20)
CO2: 25 mmol/L (ref 22–32)
Calcium: 9.8 mg/dL (ref 8.9–10.3)
Chloride: 103 mmol/L (ref 98–111)
Creatinine, Ser: 0.81 mg/dL (ref 0.44–1.00)
GFR, Estimated: >60 mL/min (ref >60)
Glucose, Bld: 93 mg/dL (ref 70–99)
Potassium: 3.8 mmol/L (ref 3.5–5.1)
Sodium: 136 mmol/L (ref 135–145)
Total Bilirubin: 0.5 mg/dL (ref 0.3–1.2)
Total Protein: 7.5 g/dL (ref 6.5–8.1)

## 2022-09-16 LAB — POCT URINE DRUG SCREEN - MANUAL ENTRY (I-SCREEN)
POC Amphetamine UR: NOT DETECTED
POC Buprenorphine (BUP): NOT DETECTED
POC Cocaine UR: NOT DETECTED
POC Marijuana UR: POSITIVE — AB
POC Methadone UR: NOT DETECTED
POC Methamphetamine UR: NOT DETECTED
POC Morphine: NOT DETECTED
POC Oxazepam (BZO): NOT DETECTED
POC Oxycodone UR: NOT DETECTED
POC Secobarbital (BAR): NOT DETECTED

## 2022-09-16 LAB — POC URINE PREG, ED: Preg Test, Ur: POSITIVE — AB

## 2022-09-16 LAB — ETHANOL: Alcohol, Ethyl (B): <10 mg/dL (ref <10)

## 2022-09-16 MED ORDER — TRAZODONE HCL 50 MG PO TABS
50.0000 mg | ORAL_TABLET | Freq: Every evening | ORAL | Status: DC | PRN
  Administered 2022-09-16: 50 mg via ORAL
  Filled 2022-09-16: qty 1

## 2022-09-16 MED ORDER — ACETAMINOPHEN 325 MG PO TABS
650.0000 mg | ORAL_TABLET | Freq: Four times a day (QID) | ORAL | Status: DC | PRN
  Administered 2022-09-17 (×2): 650 mg via ORAL
  Filled 2022-09-16 (×2): qty 2

## 2022-09-16 MED ORDER — MAGNESIUM HYDROXIDE 400 MG/5ML PO SUSP: 30.0000 mL | Freq: Every day | ORAL | Status: DC | PRN

## 2022-09-16 MED ORDER — ALUM & MAG HYDROXIDE-SIMETH 200-200-20 MG/5ML PO SUSP: 30.0000 mL | ORAL | Status: DC | PRN

## 2022-09-16 MED ORDER — CLONIDINE HCL 0.1 MG PO TABS
0.2000 mg | ORAL_TABLET | Freq: Once | ORAL | Status: AC
  Administered 2022-09-16: 0.2 mg via ORAL
  Filled 2022-09-16: qty 2

## 2022-09-16 MED ORDER — HYDROXYZINE HCL 25 MG PO TABS
25.0000 mg | ORAL_TABLET | Freq: Three times a day (TID) | ORAL | Status: DC | PRN
  Administered 2022-09-16: 25 mg via ORAL
  Filled 2022-09-16: qty 1

## 2022-09-16 NOTE — ED Notes (Signed)
Explained to patient that at this point we do have to admit her but a provider will be around in the am to reassess.  Med given for anxiety and BP. Patient agitated and angry that she is having to stay - will continue to monitor for safety

## 2022-09-16 NOTE — Progress Notes (Signed)
   09/16/22 1926  [facility] Triage Screening (Walk-ins at [facility] only)  How Did You Hear About [place]? Legal System  What Is the Reason for Your Visit/Call Today? Pt reports a diagnosis of bipolar disorder and borderline personality. Pt's mother petitioned for IVC stating Pt is verbally abusive, aggressive, and paranoid. Petitioner states Pt said she would "put herself in the grounf and murder her family." Pt acknowledges having verbal altercation with family members. She denies suicidal ideation, homicidal ideation, psychotic symptoms. She acknowledges history of using alcohol, marjuana, and cocaine. She is angry that she has been petioned for IVC.  How Long Has This Been Causing You Problems? <Week  Have You Recently Had Any Thoughts About Hurting Yourself? No  Are You Planning to Commit Suicide/Harm Yourself At This time? No  Have you Recently Had Thoughts About Hurting Someone Karolee Ohs? No  Are You Planning To Harm Someone At This Time? No  Are you currently experiencing any auditory, visual or other hallucinations? No  Have You Used Any Alcohol or Drugs in the Past 24 Hours? No  Do you have any current medical co-morbidities that require immediate attention? No  Clinician description of patient physical appearance/behavior: Pt is casually dressed, alert and oriented x4. Pt speaks in a clear tone, at moderate volume and normal pace. Motor behavior appears normal. Eye contact is good. Pt's mood is angry and affect is congruent with mood. Thought process is coherent and relevant. There is no indication she is currently responding to internal stimuli. She is generally cooperative but guarded.  What Do You Feel Would Help You the Most Today?  (Pt does not want mental health treatment)  If access to [facility] was not available, would you have sought care in the Emergency Department? Yes  Determination of Need Urgent (48 hours)  Options For Referral Inpatient Hospitalization;[facility];Medication  Management;Outpatient Therapy
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
## 2022-09-16 NOTE — ED Notes (Signed)
Patient is asking what is going to happen if her aunt doesn't answer the phone - asked provider  Provider also notified of BP

## 2022-09-16 NOTE — ED Notes (Signed)
Providers made aware of BP

## 2022-09-17 ENCOUNTER — Other Ambulatory Visit (HOSPITAL_COMMUNITY)
Admission: EM | Admit: 2022-09-17 | Discharge: 2022-09-17 | Disposition: A | Discharge status: Home / Self Care | Payer: 59 | Attending: Psychiatry | Admitting: Psychiatry

## 2022-09-17 DIAGNOSIS — F1290 Cannabis use, unspecified, uncomplicated: Secondary | ICD-10-CM | POA: Diagnosis not present

## 2022-09-17 DIAGNOSIS — F1994 Other psychoactive substance use, unspecified with psychoactive substance-induced mood disorder: Secondary | ICD-10-CM

## 2022-09-17 DIAGNOSIS — F17200 Nicotine dependence, unspecified, uncomplicated: Secondary | ICD-10-CM

## 2022-09-17 DIAGNOSIS — F319 Bipolar disorder, unspecified: Secondary | ICD-10-CM | POA: Diagnosis not present

## 2022-09-17 DIAGNOSIS — F1590 Other stimulant use, unspecified, uncomplicated: Secondary | ICD-10-CM

## 2022-09-17 LAB — LIPID PANEL
Cholesterol: 200 mg/dL (ref 0–200)
HDL: 58 mg/dL (ref >40)
LDL Cholesterol: 106 mg/dL — ABNORMAL HIGH (ref 0–99)
Total CHOL/HDL Ratio: 3.4 RATIO
Triglycerides: 182 mg/dL — ABNORMAL HIGH (ref <150)
VLDL: 36 mg/dL (ref 0–40)

## 2022-09-17 LAB — TSH: TSH: 3.134 u[IU]/mL (ref 0.350–4.500)

## 2022-09-17 LAB — HIV ANTIBODY (ROUTINE TESTING W REFLEX): HIV Screen 4th Generation wRfx: NONREACTIVE

## 2022-09-17 LAB — PREGNANCY, URINE: Preg Test, Ur: NEGATIVE

## 2022-09-17 LAB — RPR: RPR Ser Ql: NONREACTIVE

## 2022-09-17 LAB — HCG, QUANTITATIVE, PREGNANCY: hCG, Beta Chain, Quant, S: 195 m[IU]/mL — ABNORMAL HIGH (ref <5)

## 2022-09-17 MED ORDER — FOLIC ACID 1 MG PO TABS: 1.0000 mg | ORAL_TABLET | Freq: Every day | ORAL | 0 refills | Status: DC

## 2022-09-17 MED ORDER — FOLIC ACID 1 MG PO TABS
1.0000 mg | ORAL_TABLET | Freq: Once | ORAL | Status: AC
  Administered 2022-09-17: 1 mg via ORAL
  Filled 2022-09-17: qty 1

## 2022-09-17 MED ORDER — OLANZAPINE 2.5 MG PO TABS
2.5000 mg | ORAL_TABLET | Freq: Once | ORAL | Status: AC
  Administered 2022-09-17: 2.5 mg via ORAL
  Filled 2022-09-17: qty 1

## 2022-09-17 MED ORDER — ADULT MULTIVITAMIN W/MINERALS CH
1.0000 | ORAL_TABLET | Freq: Once | ORAL | Status: AC
  Administered 2022-09-17: 1 via ORAL
  Filled 2022-09-17: qty 1

## 2022-09-17 MED ORDER — FOLIC ACID 1 MG PO TABS: 1.0000 mg | ORAL_TABLET | Freq: Once | ORAL | Status: DC

## 2022-09-17 MED ORDER — FOLIC ACID 1 MG PO TABS: 1.0000 mg | ORAL_TABLET | Freq: Once | ORAL | 0 refills | Status: AC

## 2022-09-17 MED ORDER — ALUM & MAG HYDROXIDE-SIMETH 200-200-20 MG/5ML PO SUSP: 30.0000 mL | ORAL | Status: DC | PRN

## 2022-09-17 MED ORDER — ACETAMINOPHEN 325 MG PO TABS: 650.0000 mg | ORAL_TABLET | Freq: Four times a day (QID) | ORAL | Status: DC | PRN

## 2022-09-17 MED ORDER — ADULT MULTIVITAMIN W/MINERALS CH: 1.0000 | ORAL_TABLET | Freq: Once | ORAL | 0 refills | Status: AC

## 2022-09-17 MED ORDER — ADULT MULTIVITAMIN W/MINERALS CH: 1.0000 | ORAL_TABLET | Freq: Once | ORAL | Status: DC

## 2022-09-17 MED ORDER — MULTIVITAMINS PO CAPS: 1.0000 | ORAL_CAPSULE | Freq: Every day | ORAL | 0 refills | Status: DC

## 2022-09-17 MED ORDER — MAGNESIUM HYDROXIDE 400 MG/5ML PO SUSP: 30.0000 mL | Freq: Every day | ORAL | Status: DC | PRN

## 2022-09-17 MED ORDER — OLANZAPINE 10 MG PO TBDP: 10.0000 mg | ORAL_TABLET | Freq: Every day | ORAL | Status: DC

## 2022-09-17 NOTE — ED Notes (Signed)
Provider states to give patient AMA form to sign. Form s signed and in chart

## 2022-09-17 NOTE — ED Notes (Signed)
Patient alert and oriented x 3. Denies SI/HI/AVH. Denies intent or plan to harm self or others. Routine conducted according to faculty protocol. Encourage patient to notify staff with any needs or concerns. Patient verbalized agreement and understanding. Will continue to monitor for safety. 

## 2022-09-17 NOTE — Discharge Instructions (Addendum)
Dear [name] [name],  It was a pleasure to take care of you during [facility] stay at [facility] where you were treated for your Substance induced mood disorder (HCC).  While you were here, you were:  observed and cared for by our nurses and nursing assistants  treated with medications by your psychiatrists  provided individual and group therapy by therapists  provided resources by our social workers and case managers  Please review the medication list provided to you at discharge and stop, start taking, or continue taking the medications listed there.  You should also follow-up with your primary care doctor, or start seeing one if you don't have one yet.    I recommend abstinence from alcohol, tobacco, and other illicit drug use.   If your psychiatric symptoms or suicidal thoughts recur, worsen, or if you have side effects to your psychiatric medications, call your outpatient psychiatric provider, 911, [phone] or go to the nearest emergency department.  Take care!  Signed: Lorri Frederick, MD 09/17/2022, 6:43 PM  Naloxone (Narcan) can help reverse an overdose when given to the victim quickly.  [facility] offers free naloxone kits and instructions/training on its use.  Add naloxone to your first aid kit and you can help save a life. A prescription can be filled at your local pharmacy or free kits are provided by the county

## 2022-09-17 NOTE — ED Notes (Signed)
Provider states that ivc is rescinded.
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
## 2022-09-17 NOTE — ED Notes (Signed)
Patient is sitting in dining room watching television with other patients, no distress noted will continue to monitor patient for safety

## 2022-09-17 NOTE — ED Provider Notes (Signed)
[facility] Continuous Assessment Admission H&P  Date: 09/17/22 Patient Name: [name] [name] MRN: [id number] Chief Complaint: "I got in a argument with my daughters"  Diagnoses:  Final diagnoses:  None    HPI: [name] [name] is a 32 year old female with no documented psychiatric history.  Patient was brought to [facility] by law enforcement under IVC for mental health evaluation.  Patient was petitioned before IVC by her aunt Stana Wakeford [phone].   Per IVC affidavit and petition states: "Respondent has been verbally abusive and aggressive today threatening to harm her family. Respondent has a history of bipolar disorder and is showing signs of paranoia thinking her family are out to betray her. Respondents stated she would put herself in the ground and murder her family. Without intervention the respondent could harm herself and others."   Patient was evaluated face-to-face and her chart was reviewed by this nurse practitioner. On assessment, patient was initially irritable and defensive.  She says that she is frustrated and did not want to participate in assessment.  She was able to be redirected and participated in assessment.  Patient says that she lives at home with her daughters and aunt. She says that she got into a verbal argument with her daughters because they were disrespecting her aunt, Nikitha Annan.  Patient says her aunt allows patient's daughters to "wlk all over her and be disrespectful." She says during the altercationg there were name calling but no threats were made and no physical altercation. She says her aunt left the home with her daughters and moments later [facility] came to the home and picked her up. Patient denies all allegations made in the IVC.   She says she was diagnosed with bipolar disorder and borderline personality when she was a teenager she reported that she was also psychiatrically hospitalized.  She says she has not taken psychiatric medication since she was a  teenager.  She reported that she is under a lot of stress due to recent loss and "living in a home with a lot of females." She says she is grieving the loss of one of the father of her child who passed away on 2022-06-01.  Suicidal acting ideation; she denies history of suicidal attempt or self-harming.  She denies homicidal ideations, hallucination, and paranoia.  She admits to using about $50 worth of cocaine 1-2 times per week; smoking 1 blunt of marijuana 1-2 times per week; and drinks 1 to 2 cans of beer once or twice per week.  Patient says that she feels safe to return home and says that she is able to contract for safety.  She also reports that she may be able to go stay with a friend if her aunt does not want her back in aunt's home.  Collateral: TTS counselor Venda Rodes and this provider placed multiple phone calls to [facility] petitioner for collateral but no answer. HIPAA compliant V. mails left with call back information    Informed patient that she will be admitted to [facility] for continuous assessment pending collateral information   Total Time spent with patient: 30 minutes  Musculoskeletal  Strength & Muscle Tone: within normal limits Gait & Station: normal Patient leans: Right  Psychiatric Specialty Exam  Presentation General Appearance:  Appropriate for Environment  Eye Contact: Good  Speech: Clear and Coherent  Speech Volume: Normal  Handedness: Right   Mood and Affect  Mood: Irritable  Affect: Congruent   Thought Process  Thought Processes: Coherent  Descriptions of  Associations:Intact  Orientation:Full (Time, Place and Person)  Thought Content:No data recorded Diagnosis of Schizophrenia or Schizoaffective disorder in past: No   Hallucinations:Hallucinations: None  Ideas of Reference:None  Suicidal Thoughts:Suicidal Thoughts: No  Homicidal Thoughts:Homicidal Thoughts: No   Sensorium  Memory: Immediate Good; Recent Good; Remote  Good  Judgment: Fair  Insight: Good   Executive Functions  Concentration: Good  Attention Span: Good  Recall: Good  Fund of Knowledge: Good  Language: Good   Psychomotor Activity  Psychomotor Activity: Psychomotor Activity: Normal   Assets  Assets: Communication Skills; Desire for Improvement; Physical Health   Sleep  Sleep: Sleep: Fair Number of Hours of Sleep: 6   Nutritional Assessment (For OBS and FBC admissions only) Has the patient had a weight loss or gain of 10 pounds or more in the last 3 months?: No Has the patient had a decrease in food intake/or appetite?: No Does the patient have dental problems?: No Does the patient have eating habits or behaviors that may be indicators of an eating disorder including binging or inducing vomiting?: No Has the patient recently lost weight without trying?: 0 Has the patient been eating poorly because of a decreased appetite?: 0 Malnutrition Screening Tool Score: 0    Physical Exam Vitals and nursing note reviewed.  Constitutional:      General: She is not in acute distress.    Appearance: She is well-developed.  HENT:     Head: Normocephalic and atraumatic.  Eyes:     Conjunctiva/sclera: Conjunctivae normal.  Cardiovascular:     Rate and Rhythm: Normal rate.     Heart sounds: No murmur heard. Pulmonary:     Effort: Pulmonary effort is normal. No respiratory distress.  Abdominal:     Palpations: Abdomen is soft.     Tenderness: There is no abdominal tenderness.  Musculoskeletal:        General: No swelling.     Cervical back: Neck supple.  Skin:    General: Skin is warm and dry.     Capillary Refill: Capillary refill takes less than 2 seconds.  Neurological:     Mental Status: She is alert and oriented to person, place, and time.  Psychiatric:        Mood and Affect: Mood normal.    Review of Systems  Constitutional: Negative.   HENT: Negative.    Eyes: Negative.   Respiratory: Negative.     Cardiovascular: Negative.   Gastrointestinal: Negative.   Genitourinary: Negative.   Musculoskeletal: Negative.   Skin: Negative.   Neurological: Negative.   Endo/Heme/Allergies: Negative.   Psychiatric/Behavioral:  Positive for substance abuse.     Blood pressure (!) 142/94, pulse 62, temperature 99 F (37.2 C), temperature source Oral, resp. rate 18, SpO2 99 %. There is no height or weight on file to calculate BMI.  Past Psychiatric History: None     Is the patient at risk to self? No  Has the patient been a risk to self in the past 6 months? No .    Has the patient been a risk to self within the distant past? No   Is the patient a risk to others? No   Has the patient been a risk to others in the past 6 months?  patient denies   Has the patient been a risk to others within the distant past?  Patient denies   Past Medical History:  Past Medical History:  Diagnosis Date   Chorioamnionitis 11/25/2011   Group B Streptococcus carrier, +  RV culture, currently pregnant 10/29/2011   Needs intrapartum prophylaxis    Late prenatal care 08/08/2011   Started PNC at 25 weeks    NSVD (normal spontaneous vaginal delivery) 11/25/2011   Pregnancy induced hypertension 2009   Supervision of normal pregnancy 08/08/2011   Too late for genetic testing. 1 hour glucola 78.  Normal anatomy scan. Plans to bottle feed. She wants Nexplanon for birth control     Tobacco smoking complicating pregnancy 08/08/2011   Reports smoking 3 cigarettes/week.  Encouraged to quit, resources reviewed.      Family History:  Family History  Problem Relation Age of Onset   Anesthesia problems Neg Hx    Hypotension Neg Hx    Malignant hyperthermia Neg Hx    Pseudochol deficiency Neg Hx      Social History:  Social History   Tobacco Use   Smoking status: Some Days    Packs/day: .25    Types: Cigarettes    Last attempt to quit: 09/21/2011    Years since quitting: 10.9   Smokeless tobacco: Never  Substance Use  Topics   Alcohol use: No   Drug use: No     Last Labs:  Admission on 09/16/2022  Component Date Value Ref Range Status   WBC 09/16/2022 5.6  4.0 - 10.5 K/uL Final   RBC 09/16/2022 4.50  3.87 - 5.11 MIL/uL Final   Hemoglobin 09/16/2022 13.6  12.0 - 15.0 g/dL Final   HCT 16/01/9603 40.7  36.0 - 46.0 % Final   MCV 09/16/2022 90.4  80.0 - 100.0 fL Final   MCH 09/16/2022 30.2  26.0 - 34.0 pg Final   MCHC 09/16/2022 33.4  30.0 - 36.0 g/dL Final   RDW 54/12/8117 13.3  11.5 - 15.5 % Final   Platelets 09/16/2022 288  150 - 400 K/uL Final   nRBC 09/16/2022 0.0  0.0 - 0.2 % Final   Neutrophils Relative % 09/16/2022 47  % Final   Neutro Abs 09/16/2022 2.6  1.7 - 7.7 K/uL Final   Lymphocytes Relative 09/16/2022 42  % Final   Lymphs Abs 09/16/2022 2.4  0.7 - 4.0 K/uL Final   Monocytes Relative 09/16/2022 9  % Final   Monocytes Absolute 09/16/2022 0.5  0.1 - 1.0 K/uL Final   Eosinophils Relative 09/16/2022 1  % Final   Eosinophils Absolute 09/16/2022 0.1  0.0 - 0.5 K/uL Final   Basophils Relative 09/16/2022 1  % Final   Basophils Absolute 09/16/2022 0.0  0.0 - 0.1 K/uL Final   Immature Granulocytes 09/16/2022 0  % Final   Abs Immature Granulocytes 09/16/2022 0.02  0.00 - 0.07 K/uL Final   Performed at [facility], [address], [place], [place] [address]   Sodium 09/16/2022 136  135 - 145 mmol/L Final   Potassium 09/16/2022 3.8  3.5 - 5.1 mmol/L Final   Chloride 09/16/2022 103  98 - 111 mmol/L Final   CO2 09/16/2022 25  22 - 32 mmol/L Final   Glucose, Bld 09/16/2022 93  70 - 99 mg/dL Final   Glucose reference range applies only to samples taken after fasting for at least 8 hours.   BUN 09/16/2022 11  6 - 20 mg/dL Final   Creatinine, Ser 09/16/2022 0.81  0.44 - 1.00 mg/dL Final   Calcium 95/62/1308 9.8  8.9 - 10.3 mg/dL Final   Total Protein 65/78/4696 7.5  6.5 - 8.1 g/dL Final   Albumin 29/52/8413 4.2  3.5 - 5.0 g/dL Final  AST 09/16/2022 23  15 - 41 U/L Final   ALT 09/16/2022  19  0 - 44 U/L Final   Alkaline Phosphatase 09/16/2022 49  38 - 126 U/L Final   Total Bilirubin 09/16/2022 0.5  0.3 - 1.2 mg/dL Final   GFR, Estimated 09/16/2022 >60  >60 mL/min Final   Comment: (NOTE) Calculated using the CKD-EPI Creatinine Equation (2021)    Anion gap 09/16/2022 8  5 - 15 Final   Performed at [facility], [address], [place], [place] [address]   Alcohol, Ethyl (B) 09/16/2022 <10  <10 mg/dL Final   Comment: (NOTE) Lowest detectable limit for serum alcohol is 10 mg/dL.  For medical purposes only. Performed at [facility], [address] [place], [place] [address]    Cholesterol 09/16/2022 200  0 - 200 mg/dL Final   Triglycerides 91/47/8295 182 (H)  <150 mg/dL Final   HDL 62/13/0865 58  >40 mg/dL Final   Total CHOL/HDL Ratio 09/16/2022 3.4  RATIO Final   VLDL 09/16/2022 36  0 - 40 mg/dL Final   LDL Cholesterol 09/16/2022 106 (H)  0 - 99 mg/dL Final   Comment:        Total Cholesterol/HDL:CHD Risk Coronary Heart Disease Risk Table                     Men   Women  1/2 Average Risk   3.4   3.3  Average Risk       5.0   4.4  2 X Average Risk   9.6   7.1  3 X Average Risk  23.4   11.0        Use the calculated Patient Ratio above and the CHD Risk Table to determine the patient's CHD Risk.        ATP III CLASSIFICATION (LDL):  <100     mg/dL   Optimal  784-696  mg/dL   Near or Above                    Optimal  130-159  mg/dL   Borderline  295-284  mg/dL   High  >132     mg/dL   Very High Performed at [facility], [address] [place], [place] [address]    POC Amphetamine UR 09/16/2022 None Detected  NONE DETECTED (Cut Off Level 1000 ng/mL) Final   POC Secobarbital (BAR) 09/16/2022 None Detected  NONE DETECTED (Cut Off Level 300 ng/mL) Final   POC Buprenorphine (BUP) 09/16/2022 None Detected  NONE DETECTED (Cut Off Level 10 ng/mL) Final   POC Oxazepam (BZO) 09/16/2022 None Detected  NONE DETECTED (Cut Off Level 300 ng/mL) Final    POC Cocaine UR 09/16/2022 None Detected  NONE DETECTED (Cut Off Level 300 ng/mL) Final   POC Methamphetamine UR 09/16/2022 None Detected  NONE DETECTED (Cut Off Level 1000 ng/mL) Final   POC Morphine 09/16/2022 None Detected  NONE DETECTED (Cut Off Level 300 ng/mL) Final   POC Methadone UR 09/16/2022 None Detected  NONE DETECTED (Cut Off Level 300 ng/mL) Final   POC Oxycodone UR 09/16/2022 None Detected  NONE DETECTED (Cut Off Level 100 ng/mL) Final   POC Marijuana UR 09/16/2022 Positive (A)  NONE DETECTED (Cut Off Level 50 ng/mL) Final   Preg Test, Ur 09/16/2022 Positive (A)  Negative Final   TSH 09/16/2022 3.134  0.350 - 4.500 uIU/mL Final   Comment: Performed by a 3rd Generation assay  with a functional sensitivity of <=0.01 uIU/mL. Performed at [facility], [address] [place], [place] [address]     Allergies: Patient has no known allergies.  Medications:  Facility Ordered Medications  Medication   acetaminophen (TYLENOL) tablet 650 mg   alum & mag hydroxide-simeth (MAALOX/MYLANTA) 200-200-20 MG/5ML suspension 30 mL   magnesium hydroxide (MILK OF MAGNESIA) suspension 30 mL   traZODone (DESYREL) tablet 50 mg   [COMPLETED] cloNIDine (CATAPRES) tablet 0.2 mg   PTA Medications  Medication Sig   ciclopirox (LOPROX) 0.77 % cream Apply topically 2 (two) times daily.      Medical Decision Making  Patient will be admitted to [facility] for continuous assessment pending IVC collateral from IVC petitioner  Hypertension: BP was 201/122 on admission -BP extremely high. Possibly due to recent cocaine use  clonidine 0.2 mg x 1--BP improved to 140/95  First exam not completed--pending collateral        Recommendations  Based on my evaluation the patient does not appear to have an emergency medical condition.  Maricela Bo, NP 09/17/22  5:38 AM

## 2022-09-17 NOTE — ED Notes (Signed)
Patient states that she wanted to go home . Notified provider
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
## 2022-09-17 NOTE — ED Notes (Signed)
Folic acid and multivitamin was not given due to being given at 1646

## 2022-09-17 NOTE — ED Notes (Signed)
Explained to patient that her urine pregnancy test came back positive - patient is surprised but verbalizes understanding - - Patient does not want her family to be told this information at this time - providers made aware

## 2022-09-17 NOTE — ED Notes (Signed)
Patient with no sxs of distress noted at this time - will continue to monitor for safety 

## 2022-09-17 NOTE — ED Notes (Signed)
Pt admitted to  obs denies SI/HI/AVH. Calm, cooperative throughout interview process. Skin assessment completed. Oriented to unit. Meal and drink offered. At currrent, pt continue to denies SI/HI/AVH. Pt verbally contract for safety. Will monitor for safety. 

## 2022-09-17 NOTE — ED Notes (Signed)
Patient in milieu. Environment is secured. Will continue to monitor for safety. 

## 2022-09-17 NOTE — ED Notes (Signed)
Report provided to [facility] in [facility].

## 2022-09-17 NOTE — ED Notes (Signed)
Courier Service called to collect STAT specimens and deliver to [facility]. Patient tolerated stick to the L AC x1 well.
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
## 2022-09-17 NOTE — Discharge Instructions (Addendum)
Dear [name] [name],  It was a pleasure to take care of you during your stay at [facility] where you were treated for your Substance induced mood disorder (HCC).  While you were here, you were:  observed and cared for by our nurses and nursing assistants  treated with medications by your psychiatrists  provided individual and group therapy by therapists  provided resources by our social workers and case managers  Please review the medication list provided to you at discharge and stop, start taking, or continue taking the medications listed there.  You should also follow-up with your primary care doctor, or start seeing one if you don't have one yet.    I recommend abstinence from alcohol, tobacco, and other illicit drug use.   If your psychiatric symptoms or suicidal thoughts recur, worsen, or if you have side effects to your psychiatric medications, call your outpatient psychiatric provider, 911, [phone] or go to the nearest emergency department.  Take care!  Signed: Shalawn Wynder Carrion-Carrero, MD 09/17/2022, 6:43 PM  Naloxone (Narcan) can help reverse an overdose when given to the victim quickly.  [facility] offers free naloxone kits and instructions/training on its use.  Add naloxone to your first aid kit and you can help save a life. A prescription can be filled at your local pharmacy or free kits are provided by the county  

## 2022-09-18 LAB — GC/CHLAMYDIA PROBE AMP (~~LOC~~) NOT AT ARMC
Chlamydia: NEGATIVE
Comment: NEGATIVE
Comment: NORMAL
Neisseria Gonorrhea: NEGATIVE

## 2022-09-18 LAB — HEMOGLOBIN A1C
Hgb A1c MFr Bld: 5.3 % (ref 4.8–5.6)
Mean Plasma Glucose: 105 mg/dL

## 2022-11-20 ENCOUNTER — Telehealth: Payer: MEDICAID

## 2022-11-20 NOTE — Progress Notes (Signed)
Called patient at 24; VM left stating purpose of phone call and asking patient to join visit via MyChart text link. Called pt at 1430; left VM that patient will need to reschedule appts. Front office notified.  Fleet Contras RN

## 2022-11-28 ENCOUNTER — Encounter: Payer: MEDICAID | Admitting: Family Medicine

## 2023-01-17 ENCOUNTER — Encounter: Payer: MEDICAID | Admitting: Family Medicine
